# Patient Record
Sex: Male | Born: 1955
Health system: Southern US, Community
[De-identification: ages and names within clinical notes are randomized; demographics above are authoritative.]

## PROBLEM LIST (undated history)

## (undated) DIAGNOSIS — L719 Rosacea, unspecified: Secondary | ICD-10-CM

## (undated) DIAGNOSIS — K5792 Diverticulitis of intestine, part unspecified, without perforation or abscess without bleeding: Secondary | ICD-10-CM

## (undated) DIAGNOSIS — N189 Chronic kidney disease, unspecified: Secondary | ICD-10-CM

## (undated) DIAGNOSIS — I2510 Atherosclerotic heart disease of native coronary artery without angina pectoris: Secondary | ICD-10-CM

## (undated) DIAGNOSIS — C9030 Solitary plasmacytoma not having achieved remission: Secondary | ICD-10-CM

## (undated) DIAGNOSIS — K219 Gastro-esophageal reflux disease without esophagitis: Secondary | ICD-10-CM

## (undated) DIAGNOSIS — D801 Nonfamilial hypogammaglobulinemia: Secondary | ICD-10-CM

## (undated) DIAGNOSIS — Z86010 Personal history of colonic polyps: Secondary | ICD-10-CM

## (undated) DIAGNOSIS — G629 Polyneuropathy, unspecified: Secondary | ICD-10-CM

## (undated) DIAGNOSIS — C801 Malignant (primary) neoplasm, unspecified: Secondary | ICD-10-CM

## (undated) DIAGNOSIS — B999 Unspecified infectious disease: Secondary | ICD-10-CM

## (undated) DIAGNOSIS — I219 Acute myocardial infarction, unspecified: Secondary | ICD-10-CM

## (undated) DIAGNOSIS — I10 Essential (primary) hypertension: Secondary | ICD-10-CM

## (undated) DIAGNOSIS — M79604 Pain in right leg: Principal | ICD-10-CM

## (undated) DIAGNOSIS — IMO0002 Reserved for concepts with insufficient information to code with codable children: Secondary | ICD-10-CM

## (undated) DIAGNOSIS — E7800 Pure hypercholesterolemia, unspecified: Secondary | ICD-10-CM

## (undated) DIAGNOSIS — Z9484 Stem cells transplant status: Secondary | ICD-10-CM

## (undated) DIAGNOSIS — M109 Gout, unspecified: Secondary | ICD-10-CM

## (undated) DIAGNOSIS — E538 Deficiency of other specified B group vitamins: Secondary | ICD-10-CM

## (undated) HISTORY — DX: Stem cells transplant status: Z94.84

## (undated) HISTORY — DX: Gastro-esophageal reflux disease without esophagitis: K21.9

## (undated) HISTORY — DX: Gout, unspecified: M10.9

## (undated) HISTORY — DX: Atherosclerotic heart disease of native coronary artery without angina pectoris: I25.10

## (undated) HISTORY — DX: Polyneuropathy, unspecified: G62.9

## (undated) HISTORY — DX: Personal history of colonic polyps: Z86.010

## (undated) HISTORY — DX: Unspecified infectious disease: B99.9

## (undated) HISTORY — DX: Reserved for concepts with insufficient information to code with codable children: IMO0002

## (undated) HISTORY — DX: Essential (primary) hypertension: I10

## (undated) HISTORY — DX: Acute myocardial infarction, unspecified: I21.9

## (undated) HISTORY — DX: Diverticulitis of intestine, part unspecified, without perforation or abscess without bleeding: K57.92

## (undated) HISTORY — DX: Nonfamilial hypogammaglobulinemia: D80.1

## (undated) HISTORY — PX: COLONOSCOPY W/ POLYPECTOMY: SHX1380

## (undated) HISTORY — DX: Pain in right leg: M79.604

## (undated) HISTORY — DX: Rosacea, unspecified: L71.9

## (undated) HISTORY — DX: Chronic kidney disease, unspecified: N18.9

## (undated) HISTORY — DX: Pure hypercholesterolemia, unspecified: E78.00

## (undated) HISTORY — DX: Deficiency of other specified B group vitamins: E53.8

---

## 1993-08-19 HISTORY — PX: CORONARY ARTERY BYPASS GRAFT: SHX141

## 2000-06-12 ENCOUNTER — Ambulatory Visit (HOSPITAL_COMMUNITY): Admission: RE | Admit: 2000-06-12 | Discharge: 2000-06-12 | Payer: Self-pay | Admitting: Gastroenterology

## 2002-04-15 ENCOUNTER — Encounter: Payer: Self-pay | Admitting: Internal Medicine

## 2002-04-15 ENCOUNTER — Encounter: Admission: RE | Admit: 2002-04-15 | Discharge: 2002-04-15 | Payer: Self-pay | Admitting: Internal Medicine

## 2003-01-10 ENCOUNTER — Encounter: Payer: Self-pay | Admitting: Internal Medicine

## 2003-01-10 ENCOUNTER — Encounter: Admission: RE | Admit: 2003-01-10 | Discharge: 2003-01-10 | Payer: Self-pay | Admitting: Internal Medicine

## 2003-01-12 ENCOUNTER — Encounter: Payer: Self-pay | Admitting: Internal Medicine

## 2003-01-12 ENCOUNTER — Encounter: Admission: RE | Admit: 2003-01-12 | Discharge: 2003-01-12 | Payer: Self-pay | Admitting: Internal Medicine

## 2003-02-27 ENCOUNTER — Encounter: Payer: Self-pay | Admitting: Oncology

## 2003-02-27 ENCOUNTER — Ambulatory Visit (HOSPITAL_COMMUNITY): Admission: RE | Admit: 2003-02-27 | Discharge: 2003-02-27 | Payer: Self-pay | Admitting: Oncology

## 2003-03-13 ENCOUNTER — Ambulatory Visit: Admission: RE | Admit: 2003-03-13 | Discharge: 2003-04-07 | Payer: Self-pay | Admitting: Radiation Oncology

## 2003-03-14 ENCOUNTER — Encounter (INDEPENDENT_AMBULATORY_CARE_PROVIDER_SITE_OTHER): Payer: Self-pay | Admitting: Specialist

## 2003-03-14 ENCOUNTER — Other Ambulatory Visit: Admission: RE | Admit: 2003-03-14 | Discharge: 2003-03-14 | Payer: Self-pay | Admitting: Oncology

## 2003-07-07 ENCOUNTER — Ambulatory Visit (HOSPITAL_COMMUNITY): Admission: RE | Admit: 2003-07-07 | Discharge: 2003-07-07 | Payer: Self-pay | Admitting: Oncology

## 2003-07-07 ENCOUNTER — Encounter: Payer: Self-pay | Admitting: Oncology

## 2004-02-09 ENCOUNTER — Ambulatory Visit (HOSPITAL_COMMUNITY): Admission: RE | Admit: 2004-02-09 | Discharge: 2004-02-09 | Payer: Self-pay | Admitting: Oncology

## 2004-02-17 ENCOUNTER — Ambulatory Visit (HOSPITAL_COMMUNITY): Admission: RE | Admit: 2004-02-17 | Discharge: 2004-02-17 | Payer: Self-pay | Admitting: Oncology

## 2004-03-01 ENCOUNTER — Ambulatory Visit: Admission: RE | Admit: 2004-03-01 | Discharge: 2004-04-26 | Payer: Self-pay | Admitting: Radiation Oncology

## 2004-05-09 ENCOUNTER — Ambulatory Visit: Admission: RE | Admit: 2004-05-09 | Discharge: 2004-05-09 | Payer: Self-pay | Admitting: Radiation Oncology

## 2004-06-14 ENCOUNTER — Ambulatory Visit (HOSPITAL_COMMUNITY): Admission: RE | Admit: 2004-06-14 | Discharge: 2004-06-14 | Payer: Self-pay | Admitting: Oncology

## 2004-06-21 ENCOUNTER — Ambulatory Visit (HOSPITAL_COMMUNITY): Admission: RE | Admit: 2004-06-21 | Discharge: 2004-06-21 | Payer: Self-pay | Admitting: Oncology

## 2004-10-17 ENCOUNTER — Ambulatory Visit (HOSPITAL_COMMUNITY): Admission: RE | Admit: 2004-10-17 | Discharge: 2004-10-17 | Payer: Self-pay | Admitting: Oncology

## 2004-10-25 ENCOUNTER — Ambulatory Visit: Payer: Self-pay | Admitting: Oncology

## 2004-10-29 ENCOUNTER — Ambulatory Visit (HOSPITAL_COMMUNITY): Admission: RE | Admit: 2004-10-29 | Discharge: 2004-10-29 | Payer: Self-pay | Admitting: Oncology

## 2004-10-29 ENCOUNTER — Encounter (INDEPENDENT_AMBULATORY_CARE_PROVIDER_SITE_OTHER): Payer: Self-pay | Admitting: Specialist

## 2004-10-29 ENCOUNTER — Ambulatory Visit: Payer: Self-pay | Admitting: Oncology

## 2004-11-21 HISTORY — PX: OTHER SURGICAL HISTORY: SHX169

## 2004-11-23 ENCOUNTER — Ambulatory Visit (HOSPITAL_COMMUNITY): Admission: RE | Admit: 2004-11-23 | Discharge: 2004-11-23 | Payer: Self-pay | Admitting: Oncology

## 2004-11-26 ENCOUNTER — Ambulatory Visit (HOSPITAL_BASED_OUTPATIENT_CLINIC_OR_DEPARTMENT_OTHER): Admission: RE | Admit: 2004-11-26 | Discharge: 2004-11-26 | Payer: Self-pay | Admitting: General Surgery

## 2004-11-26 ENCOUNTER — Encounter (INDEPENDENT_AMBULATORY_CARE_PROVIDER_SITE_OTHER): Payer: Self-pay | Admitting: Specialist

## 2004-12-05 ENCOUNTER — Ambulatory Visit: Admission: RE | Admit: 2004-12-05 | Discharge: 2005-01-17 | Payer: Self-pay | Admitting: Radiation Oncology

## 2005-01-06 ENCOUNTER — Ambulatory Visit (HOSPITAL_COMMUNITY): Admission: RE | Admit: 2005-01-06 | Discharge: 2005-01-06 | Payer: Self-pay | Admitting: Radiation Oncology

## 2005-01-17 ENCOUNTER — Ambulatory Visit: Payer: Self-pay | Admitting: Oncology

## 2005-02-03 ENCOUNTER — Encounter (INDEPENDENT_AMBULATORY_CARE_PROVIDER_SITE_OTHER): Payer: Self-pay | Admitting: *Deleted

## 2005-02-03 ENCOUNTER — Inpatient Hospital Stay (HOSPITAL_COMMUNITY): Admission: RE | Admit: 2005-02-03 | Discharge: 2005-02-07 | Payer: Self-pay | Admitting: Orthopedic Surgery

## 2005-02-03 HISTORY — PX: TOTAL HIP ARTHROPLASTY: SHX124

## 2005-03-13 ENCOUNTER — Ambulatory Visit: Payer: Self-pay | Admitting: Oncology

## 2005-04-23 ENCOUNTER — Ambulatory Visit (HOSPITAL_COMMUNITY): Admission: RE | Admit: 2005-04-23 | Discharge: 2005-04-23 | Payer: Self-pay | Admitting: Oncology

## 2005-04-29 ENCOUNTER — Ambulatory Visit: Payer: Self-pay | Admitting: Oncology

## 2005-06-19 ENCOUNTER — Ambulatory Visit: Admission: RE | Admit: 2005-06-19 | Discharge: 2005-07-28 | Payer: Self-pay | Admitting: Radiation Oncology

## 2005-07-07 ENCOUNTER — Ambulatory Visit (HOSPITAL_COMMUNITY): Admission: RE | Admit: 2005-07-07 | Discharge: 2005-07-07 | Payer: Self-pay | Admitting: Nephrology

## 2005-07-10 ENCOUNTER — Ambulatory Visit: Payer: Self-pay | Admitting: Oncology

## 2005-07-24 ENCOUNTER — Ambulatory Visit (HOSPITAL_COMMUNITY): Admission: RE | Admit: 2005-07-24 | Discharge: 2005-07-24 | Payer: Self-pay | Admitting: Oncology

## 2005-07-28 ENCOUNTER — Ambulatory Visit (HOSPITAL_COMMUNITY): Admission: RE | Admit: 2005-07-28 | Discharge: 2005-07-28 | Payer: Self-pay | Admitting: Urology

## 2005-08-11 ENCOUNTER — Ambulatory Visit (HOSPITAL_BASED_OUTPATIENT_CLINIC_OR_DEPARTMENT_OTHER): Admission: RE | Admit: 2005-08-11 | Discharge: 2005-08-11 | Payer: Self-pay | Admitting: Urology

## 2005-08-11 ENCOUNTER — Ambulatory Visit (HOSPITAL_COMMUNITY): Admission: RE | Admit: 2005-08-11 | Discharge: 2005-08-11 | Payer: Self-pay | Admitting: Urology

## 2005-09-04 ENCOUNTER — Ambulatory Visit: Payer: Self-pay | Admitting: Oncology

## 2005-10-08 ENCOUNTER — Ambulatory Visit (HOSPITAL_COMMUNITY): Admission: RE | Admit: 2005-10-08 | Discharge: 2005-10-08 | Payer: Self-pay | Admitting: Oncology

## 2005-10-20 ENCOUNTER — Ambulatory Visit: Payer: Self-pay | Admitting: Oncology

## 2005-12-05 ENCOUNTER — Ambulatory Visit: Payer: Self-pay | Admitting: Oncology

## 2006-01-12 ENCOUNTER — Ambulatory Visit (HOSPITAL_COMMUNITY): Admission: RE | Admit: 2006-01-12 | Discharge: 2006-01-13 | Payer: Self-pay | Admitting: Cardiology

## 2006-01-26 ENCOUNTER — Ambulatory Visit (HOSPITAL_BASED_OUTPATIENT_CLINIC_OR_DEPARTMENT_OTHER): Admission: RE | Admit: 2006-01-26 | Discharge: 2006-01-26 | Payer: Self-pay | Admitting: Urology

## 2006-02-05 ENCOUNTER — Ambulatory Visit: Payer: Self-pay | Admitting: Oncology

## 2006-02-06 ENCOUNTER — Ambulatory Visit (HOSPITAL_COMMUNITY): Admission: RE | Admit: 2006-02-06 | Discharge: 2006-02-06 | Payer: Self-pay | Admitting: Oncology

## 2006-02-06 ENCOUNTER — Encounter (INDEPENDENT_AMBULATORY_CARE_PROVIDER_SITE_OTHER): Payer: Self-pay | Admitting: Specialist

## 2006-02-19 DIAGNOSIS — Z9484 Stem cells transplant status: Secondary | ICD-10-CM

## 2006-02-19 HISTORY — DX: Stem cells transplant status: Z94.84

## 2006-03-27 ENCOUNTER — Ambulatory Visit: Payer: Self-pay | Admitting: Oncology

## 2006-03-30 LAB — URIC ACID: Uric Acid, Serum: 5.4 mg/dL (ref 2.4–7.0)

## 2006-03-30 LAB — COMPREHENSIVE METABOLIC PANEL
ALT: 13 U/L (ref 0–40)
AST: 15 U/L (ref 0–37)
Calcium: 9.5 mg/dL (ref 8.4–10.5)
Chloride: 106 mEq/L (ref 96–112)
Creatinine, Ser: 1.2 mg/dL (ref 0.4–1.5)
Potassium: 4.6 mEq/L (ref 3.5–5.3)
Sodium: 140 mEq/L (ref 135–145)
Total Protein: 6.5 g/dL (ref 6.0–8.3)

## 2006-03-30 LAB — CBC WITH DIFFERENTIAL/PLATELET
BASO%: 0.1 % (ref 0.0–2.0)
EOS%: 2.3 % (ref 0.0–7.0)
MCH: 29.8 pg (ref 28.0–33.4)
MCHC: 33.9 g/dL (ref 32.0–35.9)
MONO#: 1 10*3/uL — ABNORMAL HIGH (ref 0.1–0.9)
NEUT%: 74.9 % (ref 40.0–75.0)
RBC: 4.01 10*6/uL — ABNORMAL LOW (ref 4.20–5.71)
RDW: 16.8 % — ABNORMAL HIGH (ref 11.2–14.6)
WBC: 6.2 10*3/uL (ref 4.0–10.0)
lymph#: 0.4 10*3/uL — ABNORMAL LOW (ref 0.9–3.3)

## 2006-04-06 LAB — CBC WITH DIFFERENTIAL/PLATELET
BASO%: 0 % (ref 0.0–2.0)
EOS%: 0.6 % (ref 0.0–7.0)
HCT: 37.5 % — ABNORMAL LOW (ref 38.7–49.9)
LYMPH%: 9.6 % — ABNORMAL LOW (ref 14.0–48.0)
MCH: 30.2 pg (ref 28.0–33.4)
MCHC: 34.1 g/dL (ref 32.0–35.9)
NEUT%: 78.4 % — ABNORMAL HIGH (ref 40.0–75.0)
lymph#: 0.7 10*3/uL — ABNORMAL LOW (ref 0.9–3.3)

## 2006-04-06 LAB — COMPREHENSIVE METABOLIC PANEL
ALT: 12 U/L (ref 0–40)
AST: 16 U/L (ref 0–37)
Alkaline Phosphatase: 42 U/L (ref 39–117)
Chloride: 108 mEq/L (ref 96–112)
Creatinine, Ser: 1.1 mg/dL (ref 0.4–1.5)
Total Bilirubin: 0.7 mg/dL (ref 0.3–1.2)

## 2006-04-13 LAB — CBC WITH DIFFERENTIAL/PLATELET
BASO%: 0.3 % (ref 0.0–2.0)
EOS%: 0.4 % (ref 0.0–7.0)
HCT: 33.6 % — ABNORMAL LOW (ref 38.7–49.9)
LYMPH%: 10.2 % — ABNORMAL LOW (ref 14.0–48.0)
MCH: 31.1 pg (ref 28.0–33.4)
MCHC: 34.5 g/dL (ref 32.0–35.9)
NEUT%: 75.1 % — ABNORMAL HIGH (ref 40.0–75.0)
Platelets: 153 10*3/uL (ref 145–400)

## 2006-04-13 LAB — COMPREHENSIVE METABOLIC PANEL
Albumin: 4.6 g/dL (ref 3.5–5.2)
BUN: 14 mg/dL (ref 6–23)
CO2: 24 mEq/L (ref 19–32)
Calcium: 9.5 mg/dL (ref 8.4–10.5)
Chloride: 110 mEq/L (ref 96–112)
Creatinine, Ser: 0.9 mg/dL (ref 0.4–1.5)
Glucose, Bld: 106 mg/dL — ABNORMAL HIGH (ref 70–99)
Potassium: 4.3 mEq/L (ref 3.5–5.3)

## 2006-04-20 LAB — COMPREHENSIVE METABOLIC PANEL
ALT: 12 U/L (ref 0–40)
AST: 11 U/L (ref 0–37)
Albumin: 4.3 g/dL (ref 3.5–5.2)
BUN: 7 mg/dL (ref 6–23)
CO2: 26 mEq/L (ref 19–32)
Calcium: 9.1 mg/dL (ref 8.4–10.5)
Chloride: 111 mEq/L (ref 96–112)
Creatinine, Ser: 1 mg/dL (ref 0.4–1.5)
Potassium: 4.1 mEq/L (ref 3.5–5.3)

## 2006-04-20 LAB — CBC WITH DIFFERENTIAL/PLATELET
BASO%: 0.3 % (ref 0.0–2.0)
Basophils Absolute: 0 10*3/uL (ref 0.0–0.1)
EOS%: 0.6 % (ref 0.0–7.0)
HCT: 33.7 % — ABNORMAL LOW (ref 38.7–49.9)
HGB: 11.5 g/dL — ABNORMAL LOW (ref 13.0–17.1)
MCH: 31.1 pg (ref 28.0–33.4)
MONO#: 0.7 10*3/uL (ref 0.1–0.9)
NEUT#: 3.7 10*3/uL (ref 1.5–6.5)
NEUT%: 73.6 % (ref 40.0–75.0)
RDW: 16.9 % — ABNORMAL HIGH (ref 11.2–14.6)
WBC: 5.1 10*3/uL (ref 4.0–10.0)
lymph#: 0.6 10*3/uL — ABNORMAL LOW (ref 0.9–3.3)

## 2006-05-25 ENCOUNTER — Ambulatory Visit: Payer: Self-pay | Admitting: Oncology

## 2006-05-27 ENCOUNTER — Ambulatory Visit (HOSPITAL_COMMUNITY): Admission: RE | Admit: 2006-05-27 | Discharge: 2006-05-27 | Payer: Self-pay | Admitting: Oncology

## 2006-05-27 LAB — CBC WITH DIFFERENTIAL/PLATELET
Basophils Absolute: 0 10*3/uL (ref 0.0–0.1)
EOS%: 0.7 % (ref 0.0–7.0)
Eosinophils Absolute: 0 10*3/uL (ref 0.0–0.5)
HCT: 38.4 % — ABNORMAL LOW (ref 38.7–49.9)
HGB: 13.3 g/dL (ref 13.0–17.1)
MCH: 31.6 pg (ref 28.0–33.4)
MCV: 91.3 fL (ref 81.6–98.0)
MONO%: 18.8 % — ABNORMAL HIGH (ref 0.0–13.0)
NEUT%: 66.6 % (ref 40.0–75.0)
lymph#: 0.5 10*3/uL — ABNORMAL LOW (ref 0.9–3.3)

## 2006-05-29 LAB — COMPREHENSIVE METABOLIC PANEL
Albumin: 4.7 g/dL (ref 3.5–5.2)
Alkaline Phosphatase: 37 U/L — ABNORMAL LOW (ref 39–117)
BUN: 17 mg/dL (ref 6–23)
CO2: 27 mEq/L (ref 19–32)
Calcium: 9.5 mg/dL (ref 8.4–10.5)
Glucose, Bld: 100 mg/dL — ABNORMAL HIGH (ref 70–99)
Potassium: 4.9 mEq/L (ref 3.5–5.3)

## 2006-05-29 LAB — SPEP & IFE WITH QIG
Albumin ELP: 69.7 % — ABNORMAL HIGH (ref 55.8–66.1)
Alpha-1-Globulin: 4.5 % (ref 2.9–4.9)
Alpha-2-Globulin: 11 % (ref 7.1–11.8)
IgM, Serum: 22 mg/dL — ABNORMAL LOW (ref 60–263)

## 2006-05-29 LAB — LACTATE DEHYDROGENASE: LDH: 148 U/L (ref 94–250)

## 2006-05-29 LAB — KAPPA/LAMBDA LIGHT CHAINS
Kappa free light chain: 0.93 mg/dL (ref 0.33–1.94)
Kappa:Lambda Ratio: 3.72 — ABNORMAL HIGH (ref 0.26–1.65)
Lambda Free Lght Chn: 0.25 mg/dL — ABNORMAL LOW (ref 0.57–2.63)

## 2006-05-29 LAB — BETA 2 MICROGLOBULIN, SERUM: Beta-2 Microglobulin: 2.14 mg/L — ABNORMAL HIGH (ref 1.01–1.73)

## 2006-08-30 ENCOUNTER — Ambulatory Visit: Payer: Self-pay | Admitting: Oncology

## 2006-09-01 ENCOUNTER — Encounter (INDEPENDENT_AMBULATORY_CARE_PROVIDER_SITE_OTHER): Payer: Self-pay | Admitting: *Deleted

## 2006-09-01 ENCOUNTER — Other Ambulatory Visit: Admission: RE | Admit: 2006-09-01 | Discharge: 2006-09-01 | Payer: Self-pay | Admitting: Oncology

## 2006-09-01 LAB — CBC WITH DIFFERENTIAL/PLATELET
BASO%: 0.4 % (ref 0.0–2.0)
EOS%: 1.9 % (ref 0.0–7.0)
MCH: 30.5 pg (ref 28.0–33.4)
MCHC: 34.6 g/dL (ref 32.0–35.9)
MONO%: 13.3 % — ABNORMAL HIGH (ref 0.0–13.0)
NEUT%: 70.3 % (ref 40.0–75.0)
RDW: 13.4 % (ref 11.2–14.6)
lymph#: 0.7 10*3/uL — ABNORMAL LOW (ref 0.9–3.3)

## 2006-09-04 LAB — SPEP & IFE WITH QIG
Alpha-2-Globulin: 10.7 % (ref 7.1–11.8)
Beta 2: 2.9 % — ABNORMAL LOW (ref 3.2–6.5)
Gamma Globulin: 6.4 % — ABNORMAL LOW (ref 11.1–18.8)
IgG (Immunoglobin G), Serum: 375 mg/dL — ABNORMAL LOW (ref 694–1618)

## 2006-09-04 LAB — BETA 2 MICROGLOBULIN, SERUM: Beta-2 Microglobulin: 1.94 mg/L — ABNORMAL HIGH (ref 1.01–1.73)

## 2006-09-04 LAB — COMPREHENSIVE METABOLIC PANEL
Albumin: 4.9 g/dL (ref 3.5–5.2)
Alkaline Phosphatase: 48 U/L (ref 39–117)
BUN: 19 mg/dL (ref 6–23)
Glucose, Bld: 125 mg/dL — ABNORMAL HIGH (ref 70–99)
Total Bilirubin: 0.7 mg/dL (ref 0.3–1.2)

## 2006-09-04 LAB — KAPPA/LAMBDA LIGHT CHAINS: Kappa free light chain: 0.93 mg/dL (ref 0.33–1.94)

## 2006-09-11 ENCOUNTER — Ambulatory Visit (HOSPITAL_COMMUNITY): Admission: RE | Admit: 2006-09-11 | Discharge: 2006-09-11 | Payer: Self-pay | Admitting: Oncology

## 2006-09-14 LAB — CBC WITH DIFFERENTIAL/PLATELET
BASO%: 0.4 % (ref 0.0–2.0)
LYMPH%: 9.8 % — ABNORMAL LOW (ref 14.0–48.0)
MCHC: 34.9 g/dL (ref 32.0–35.9)
MONO#: 0.6 10*3/uL (ref 0.1–0.9)
Platelets: 132 10*3/uL — ABNORMAL LOW (ref 145–400)
RBC: 4.5 10*6/uL (ref 4.20–5.71)
RDW: 13.5 % (ref 11.2–14.6)
WBC: 5.9 10*3/uL (ref 4.0–10.0)
lymph#: 0.6 10*3/uL — ABNORMAL LOW (ref 0.9–3.3)

## 2006-09-14 LAB — ERYTHROCYTE SEDIMENTATION RATE: Sed Rate: 6 mm/hr (ref 0–20)

## 2006-09-19 LAB — COMPREHENSIVE METABOLIC PANEL
ALT: 19 U/L (ref 0–40)
AST: 13 U/L (ref 0–37)
Calcium: 9.5 mg/dL (ref 8.4–10.5)
Chloride: 103 mEq/L (ref 96–112)
Creatinine, Ser: 1.19 mg/dL (ref 0.40–1.50)
Total Bilirubin: 0.8 mg/dL (ref 0.3–1.2)

## 2006-09-19 LAB — UIFE/LIGHT CHAINS/TP QN, 24-HR UR
Free Kappa Lt Chains,Ur: 0.79 mg/dL (ref 0.04–1.51)
Free Lt Chn Excr Rate: 14.22 mg/d

## 2006-09-19 LAB — SPEP & IFE WITH QIG
Beta 2: 3.7 % (ref 3.2–6.5)
Gamma Globulin: 5.9 % — ABNORMAL LOW (ref 11.1–18.8)
IgA: 64 mg/dL — ABNORMAL LOW (ref 68–378)
IgM, Serum: 45 mg/dL — ABNORMAL LOW (ref 60–263)

## 2006-09-19 LAB — KAPPA/LAMBDA LIGHT CHAINS
Kappa:Lambda Ratio: 1.3 (ref 0.26–1.65)
Lambda Free Lght Chn: 0.5 mg/dL — ABNORMAL LOW (ref 0.57–2.63)

## 2006-12-09 ENCOUNTER — Ambulatory Visit: Payer: Self-pay | Admitting: Oncology

## 2006-12-14 LAB — CBC WITH DIFFERENTIAL/PLATELET
Basophils Absolute: 0.1 10*3/uL (ref 0.0–0.1)
EOS%: 2.8 % (ref 0.0–7.0)
HGB: 14.5 g/dL (ref 13.0–17.1)
MCH: 29.2 pg (ref 28.0–33.4)
MCV: 85.2 fL (ref 81.6–98.0)
MONO%: 13.8 % — ABNORMAL HIGH (ref 0.0–13.0)
NEUT%: 66.8 % (ref 40.0–75.0)
RDW: 12.5 % (ref 11.2–14.6)

## 2006-12-18 LAB — COMPREHENSIVE METABOLIC PANEL
AST: 12 U/L (ref 0–37)
Alkaline Phosphatase: 54 U/L (ref 39–117)
BUN: 25 mg/dL — ABNORMAL HIGH (ref 6–23)
Creatinine, Ser: 1.17 mg/dL (ref 0.40–1.50)
Potassium: 5 mEq/L (ref 3.5–5.3)

## 2006-12-18 LAB — SPEP & IFE WITH QIG
Albumin ELP: 64.4 % (ref 55.8–66.1)
IgA: 100 mg/dL (ref 68–378)
IgM, Serum: 128 mg/dL (ref 60–263)
Total Protein, Serum Electrophoresis: 6.8 g/dL (ref 6.0–8.3)

## 2006-12-18 LAB — KAPPA/LAMBDA LIGHT CHAINS
Kappa:Lambda Ratio: 1.64 (ref 0.26–1.65)
Lambda Free Lght Chn: 0.75 mg/dL (ref 0.57–2.63)

## 2006-12-29 LAB — UIFE/LIGHT CHAINS/TP QN, 24-HR UR
Alpha 2, Urine: DETECTED — AB
Beta, Urine: DETECTED — AB
Free Kappa Lt Chains,Ur: 1.55 mg/dL — ABNORMAL HIGH (ref 0.04–1.51)
Free Lambda Lt Chains,Ur: 0.12 mg/dL (ref 0.08–1.01)
Free Lt Chn Excr Rate: 25.58 mg/d
Volume, Urine: 1650 mL

## 2007-02-08 ENCOUNTER — Ambulatory Visit (HOSPITAL_COMMUNITY): Admission: RE | Admit: 2007-02-08 | Discharge: 2007-02-08 | Payer: Self-pay | Admitting: Oncology

## 2007-02-08 ENCOUNTER — Ambulatory Visit: Payer: Self-pay | Admitting: Oncology

## 2007-02-08 LAB — CBC WITH DIFFERENTIAL/PLATELET
Basophils Absolute: 0.1 10*3/uL (ref 0.0–0.1)
EOS%: 0.4 % (ref 0.0–7.0)
HCT: 38.6 % — ABNORMAL LOW (ref 38.7–49.9)
HGB: 13.5 g/dL (ref 13.0–17.1)
LYMPH%: 10.7 % — ABNORMAL LOW (ref 14.0–48.0)
MCH: 29.3 pg (ref 28.0–33.4)
MCHC: 34.9 g/dL (ref 32.0–35.9)
MCV: 83.8 fL (ref 81.6–98.0)
MONO%: 24.2 % — ABNORMAL HIGH (ref 0.0–13.0)
NEUT%: 63.9 % (ref 40.0–75.0)
Platelets: 151 10*3/uL (ref 145–400)

## 2007-02-08 LAB — COMPREHENSIVE METABOLIC PANEL
Albumin: 3.9 g/dL (ref 3.5–5.2)
BUN: 21 mg/dL (ref 6–23)
CO2: 26 mEq/L (ref 19–32)
Calcium: 9.4 mg/dL (ref 8.4–10.5)
Chloride: 105 mEq/L (ref 96–112)
Creatinine, Ser: 1.54 mg/dL — ABNORMAL HIGH (ref 0.40–1.50)
Potassium: 3.7 mEq/L (ref 3.5–5.3)

## 2007-02-08 LAB — LACTATE DEHYDROGENASE: LDH: 163 U/L (ref 94–250)

## 2007-02-11 LAB — BASIC METABOLIC PANEL
Calcium: 9.2 mg/dL (ref 8.4–10.5)
Sodium: 140 mEq/L (ref 135–145)

## 2007-02-11 LAB — THROAT CULTURE

## 2007-02-14 LAB — CULTURE, BLOOD (SINGLE)

## 2007-02-16 ENCOUNTER — Ambulatory Visit (HOSPITAL_COMMUNITY): Admission: RE | Admit: 2007-02-16 | Discharge: 2007-02-16 | Payer: Self-pay | Admitting: Oncology

## 2007-02-17 LAB — CYTOMEGALOVIRUS PCR, QUALITATIVE: Cytomegalovirus DNA: NOT DETECTED

## 2007-02-18 LAB — MYCOPLASMA PNEUMONIAE,IGG,IGM: M. pneumoniae Ab, IgG: <0.91 Index (ref <0.91)

## 2007-03-23 ENCOUNTER — Ambulatory Visit: Payer: Self-pay | Admitting: Oncology

## 2007-03-26 LAB — CBC WITH DIFFERENTIAL/PLATELET
BASO%: 0.7 % (ref 0.0–2.0)
Basophils Absolute: 0 10*3/uL (ref 0.0–0.1)
HCT: 39.3 % (ref 38.7–49.9)
HGB: 14 g/dL (ref 13.0–17.1)
MCHC: 35.7 g/dL (ref 32.0–35.9)
MONO#: 0.7 10*3/uL (ref 0.1–0.9)
NEUT%: 55.6 % (ref 40.0–75.0)
WBC: 5.4 10*3/uL (ref 4.0–10.0)
lymph#: 1.6 10*3/uL (ref 0.9–3.3)

## 2007-03-26 LAB — COMPREHENSIVE METABOLIC PANEL
ALT: 17 U/L (ref 0–53)
CO2: 25 mEq/L (ref 19–32)
Calcium: 9.6 mg/dL (ref 8.4–10.5)
Chloride: 104 mEq/L (ref 96–112)
Creatinine, Ser: 1.08 mg/dL (ref 0.40–1.50)

## 2007-03-29 ENCOUNTER — Ambulatory Visit (HOSPITAL_COMMUNITY): Admission: RE | Admit: 2007-03-29 | Discharge: 2007-03-29 | Payer: Self-pay | Admitting: Oncology

## 2007-04-26 LAB — CBC WITH DIFFERENTIAL/PLATELET
Basophils Absolute: 0 10*3/uL (ref 0.0–0.1)
Eosinophils Absolute: 0.1 10*3/uL (ref 0.0–0.5)
HCT: 35.4 % — ABNORMAL LOW (ref 38.7–49.9)
HGB: 12.6 g/dL — ABNORMAL LOW (ref 13.0–17.1)
MONO#: 0.8 10*3/uL (ref 0.1–0.9)
NEUT#: 3.6 10*3/uL (ref 1.5–6.5)
NEUT%: 62.1 % (ref 40.0–75.0)
RDW: 14.4 % (ref 11.2–14.6)
lymph#: 1.3 10*3/uL (ref 0.9–3.3)

## 2007-04-26 LAB — COMPREHENSIVE METABOLIC PANEL
AST: 14 U/L (ref 0–37)
Albumin: 4.5 g/dL (ref 3.5–5.2)
BUN: 26 mg/dL — ABNORMAL HIGH (ref 6–23)
CO2: 25 mEq/L (ref 19–32)
Calcium: 9.3 mg/dL (ref 8.4–10.5)
Chloride: 106 mEq/L (ref 96–112)
Creatinine, Ser: 1.14 mg/dL (ref 0.40–1.50)
Glucose, Bld: 89 mg/dL (ref 70–99)
Potassium: 4.3 mEq/L (ref 3.5–5.3)

## 2007-05-10 ENCOUNTER — Ambulatory Visit: Payer: Self-pay | Admitting: Oncology

## 2007-06-11 ENCOUNTER — Ambulatory Visit: Payer: Self-pay | Admitting: Oncology

## 2007-06-11 ENCOUNTER — Ambulatory Visit (HOSPITAL_COMMUNITY): Admission: RE | Admit: 2007-06-11 | Discharge: 2007-06-11 | Payer: Self-pay | Admitting: Oncology

## 2007-06-11 LAB — CBC WITH DIFFERENTIAL/PLATELET
Basophils Absolute: 0 10*3/uL (ref 0.0–0.1)
EOS%: 0.4 % (ref 0.0–7.0)
HGB: 14.3 g/dL (ref 13.0–17.1)
MCH: 30.9 pg (ref 28.0–33.4)
MONO#: 1 10*3/uL — ABNORMAL HIGH (ref 0.1–0.9)
NEUT#: 7.4 10*3/uL — ABNORMAL HIGH (ref 1.5–6.5)
RDW: 13.9 % (ref 11.2–14.6)
WBC: 9.8 10*3/uL (ref 4.0–10.0)
lymph#: 1.3 10*3/uL (ref 0.9–3.3)

## 2007-06-11 LAB — BASIC METABOLIC PANEL
BUN: 16 mg/dL (ref 6–23)
CO2: 28 mEq/L (ref 19–32)
Chloride: 104 mEq/L (ref 96–112)
Potassium: 4.9 mEq/L (ref 3.5–5.3)

## 2007-07-05 LAB — CBC WITH DIFFERENTIAL/PLATELET
Basophils Absolute: 0 10*3/uL (ref 0.0–0.1)
Eosinophils Absolute: 0.1 10*3/uL (ref 0.0–0.5)
HCT: 39.8 % (ref 38.7–49.9)
HGB: 14.2 g/dL (ref 13.0–17.1)
MCH: 31.3 pg (ref 28.0–33.4)
MONO#: 1.1 10*3/uL — ABNORMAL HIGH (ref 0.1–0.9)
NEUT#: 4.9 10*3/uL (ref 1.5–6.5)
NEUT%: 63.1 % (ref 40.0–75.0)
WBC: 7.7 10*3/uL (ref 4.0–10.0)
lymph#: 1.6 10*3/uL (ref 0.9–3.3)

## 2007-07-07 LAB — PROTEIN ELECTROPHORESIS, SERUM
Albumin ELP: 64.6 % (ref 55.8–66.1)
Alpha-1-Globulin: 4.4 % (ref 2.9–4.9)

## 2007-07-07 LAB — LACTATE DEHYDROGENASE: LDH: 123 U/L (ref 94–250)

## 2007-07-07 LAB — COMPREHENSIVE METABOLIC PANEL
Albumin: 5 g/dL (ref 3.5–5.2)
BUN: 25 mg/dL — ABNORMAL HIGH (ref 6–23)
CO2: 25 mEq/L (ref 19–32)
Calcium: 10 mg/dL (ref 8.4–10.5)
Chloride: 103 mEq/L (ref 96–112)
Creatinine, Ser: 1.39 mg/dL (ref 0.40–1.50)
Glucose, Bld: 103 mg/dL — ABNORMAL HIGH (ref 70–99)
Potassium: 4.7 mEq/L (ref 3.5–5.3)

## 2007-08-03 ENCOUNTER — Ambulatory Visit: Payer: Self-pay | Admitting: Oncology

## 2007-08-03 ENCOUNTER — Ambulatory Visit (HOSPITAL_COMMUNITY): Admission: RE | Admit: 2007-08-03 | Discharge: 2007-08-03 | Payer: Self-pay | Admitting: Oncology

## 2007-08-03 LAB — COMPREHENSIVE METABOLIC PANEL
ALT: 31 U/L (ref 0–53)
AST: 30 U/L (ref 0–37)
Alkaline Phosphatase: 52 U/L (ref 39–117)
CO2: 26 mEq/L (ref 19–32)
Creatinine, Ser: 1.38 mg/dL (ref 0.40–1.50)
Sodium: 139 mEq/L (ref 135–145)
Total Bilirubin: 0.8 mg/dL (ref 0.3–1.2)
Total Protein: 7.5 g/dL (ref 6.0–8.3)

## 2007-08-03 LAB — CBC WITH DIFFERENTIAL/PLATELET
BASO%: 0.4 % (ref 0.0–2.0)
EOS%: 2.6 % (ref 0.0–7.0)
HCT: 38.8 % (ref 38.7–49.9)
HGB: 13.9 g/dL (ref 13.0–17.1)
MCH: 31.9 pg (ref 28.0–33.4)
MCHC: 35.9 g/dL (ref 32.0–35.9)
MONO#: 0.8 10*3/uL (ref 0.1–0.9)
NEUT%: 51.1 % (ref 40.0–75.0)
RDW: 15.1 % — ABNORMAL HIGH (ref 11.2–14.6)
WBC: 4 10*3/uL (ref 4.0–10.0)
lymph#: 1 10*3/uL (ref 0.9–3.3)

## 2007-08-03 LAB — URINALYSIS, MICROSCOPIC - CHCC
Bilirubin (Urine): NEGATIVE
Ketones: NEGATIVE mg/dL
Protein: NEGATIVE mg/dL
RBC count: NEGATIVE (ref 0–2)
Specific Gravity, Urine: 1.025 (ref 1.003–1.035)

## 2007-08-05 LAB — URINE CULTURE

## 2007-08-12 LAB — T-HELPER CELLS (CD4) COUNT (NOT AT ARMC)

## 2007-08-31 ENCOUNTER — Emergency Department (HOSPITAL_COMMUNITY): Admission: EM | Admit: 2007-08-31 | Discharge: 2007-08-31 | Payer: Self-pay | Admitting: *Deleted

## 2007-09-22 ENCOUNTER — Ambulatory Visit: Payer: Self-pay | Admitting: Oncology

## 2007-09-24 LAB — CBC WITH DIFFERENTIAL/PLATELET
BASO%: 0.4 % (ref 0.0–2.0)
Eosinophils Absolute: 0.1 10*3/uL (ref 0.0–0.5)
HCT: 38.5 % — ABNORMAL LOW (ref 38.7–49.9)
LYMPH%: 25.7 % (ref 14.0–48.0)
MCHC: 36.2 g/dL — ABNORMAL HIGH (ref 32.0–35.9)
MONO#: 0.6 10*3/uL (ref 0.1–0.9)
NEUT#: 3.3 10*3/uL (ref 1.5–6.5)
NEUT%: 60.8 % (ref 40.0–75.0)
Platelets: 158 10*3/uL (ref 145–400)
WBC: 5.4 10*3/uL (ref 4.0–10.0)
lymph#: 1.4 10*3/uL (ref 0.9–3.3)

## 2007-09-28 LAB — COMPREHENSIVE METABOLIC PANEL
ALT: 22 U/L (ref 0–53)
CO2: 25 mEq/L (ref 19–32)
Calcium: 9.3 mg/dL (ref 8.4–10.5)
Chloride: 105 mEq/L (ref 96–112)
Creatinine, Ser: 1.25 mg/dL (ref 0.40–1.50)
Glucose, Bld: 97 mg/dL (ref 70–99)
Sodium: 142 mEq/L (ref 135–145)
Total Bilirubin: 0.7 mg/dL (ref 0.3–1.2)
Total Protein: 7 g/dL (ref 6.0–8.3)

## 2007-09-28 LAB — LACTATE DEHYDROGENASE: LDH: 133 U/L (ref 94–250)

## 2007-09-28 LAB — PROTEIN ELECTROPHORESIS, SERUM
Alpha-2-Globulin: 10.6 % (ref 7.1–11.8)
Beta Globulin: 6.8 % (ref 4.7–7.2)
Gamma Globulin: 8.8 % — ABNORMAL LOW (ref 11.1–18.8)

## 2007-09-28 LAB — IGG, IGA, IGM: IgG (Immunoglobin G), Serum: 692 mg/dL — ABNORMAL LOW (ref 694–1618)

## 2007-10-11 ENCOUNTER — Ambulatory Visit (HOSPITAL_COMMUNITY): Admission: RE | Admit: 2007-10-11 | Discharge: 2007-10-11 | Payer: Self-pay | Admitting: Oncology

## 2007-10-11 LAB — URINALYSIS, MICROSCOPIC - CHCC
Glucose: NEGATIVE g/dL
Nitrite: NEGATIVE
Protein: 30 mg/dL
Specific Gravity, Urine: 1.025 (ref 1.003–1.035)
pH: 6 (ref 4.6–8.0)

## 2007-10-11 LAB — COMPREHENSIVE METABOLIC PANEL
ALT: 31 U/L (ref 0–53)
AST: 34 U/L (ref 0–37)
Albumin: 4.6 g/dL (ref 3.5–5.2)
BUN: 17 mg/dL (ref 6–23)
CO2: 28 mEq/L (ref 19–32)
Calcium: 9.8 mg/dL (ref 8.4–10.5)
Chloride: 102 mEq/L (ref 96–112)
Potassium: 4.2 mEq/L (ref 3.5–5.3)

## 2007-10-11 LAB — CBC WITH DIFFERENTIAL/PLATELET
Basophils Absolute: 0 10*3/uL (ref 0.0–0.1)
Eosinophils Absolute: 0.1 10*3/uL (ref 0.0–0.5)
HCT: 41.2 % (ref 38.7–49.9)
HGB: 14.4 g/dL (ref 13.0–17.1)
LYMPH%: 12.8 % — ABNORMAL LOW (ref 14.0–48.0)
MCV: 89.7 fL (ref 81.6–98.0)
MONO#: 0.8 10*3/uL (ref 0.1–0.9)
NEUT#: 4.3 10*3/uL (ref 1.5–6.5)
NEUT%: 71.7 % (ref 40.0–75.0)
Platelets: 136 10*3/uL — ABNORMAL LOW (ref 145–400)
WBC: 6.1 10*3/uL (ref 4.0–10.0)

## 2007-10-14 LAB — CULTURE, RESPIRATORY W GRAM STAIN

## 2007-10-15 LAB — CMV IGM: CMV IgM: <0.9 Index (ref <0.90)

## 2007-10-15 LAB — HIV ANTIBODY (ROUTINE TESTING W REFLEX)

## 2007-11-09 ENCOUNTER — Encounter: Admission: RE | Admit: 2007-11-09 | Discharge: 2007-11-09 | Payer: Self-pay | Admitting: Infectious Diseases

## 2007-11-09 ENCOUNTER — Ambulatory Visit: Payer: Self-pay | Admitting: Infectious Diseases

## 2007-11-09 DIAGNOSIS — C9001 Multiple myeloma in remission: Secondary | ICD-10-CM

## 2007-11-09 DIAGNOSIS — R509 Fever, unspecified: Secondary | ICD-10-CM

## 2007-11-09 DIAGNOSIS — G4700 Insomnia, unspecified: Secondary | ICD-10-CM | POA: Insufficient documentation

## 2007-11-09 DIAGNOSIS — Z96649 Presence of unspecified artificial hip joint: Secondary | ICD-10-CM | POA: Insufficient documentation

## 2007-11-09 DIAGNOSIS — I25810 Atherosclerosis of coronary artery bypass graft(s) without angina pectoris: Secondary | ICD-10-CM | POA: Insufficient documentation

## 2007-11-09 DIAGNOSIS — I10 Essential (primary) hypertension: Secondary | ICD-10-CM | POA: Insufficient documentation

## 2007-11-09 LAB — CONVERTED CEMR LAB
Basophils Relative: 0 % (ref 0–1)
Cytomegalovirus Ab-IgG: NEGATIVE
EBV VCA IgG: 3.71 — ABNORMAL HIGH
EBV VCA IgM: 0.1
Eosinophils Absolute: 0.1 10*3/uL — ABNORMAL LOW (ref 0.2–0.7)
Hemoglobin: 14.7 g/dL (ref 13.0–17.0)
Lymphs Abs: 1.6 10*3/uL (ref 0.7–4.0)
MCHC: 33.3 g/dL (ref 30.0–36.0)
Monocytes Relative: 12 % (ref 3–12)
Neutro Abs: 2.4 10*3/uL (ref 1.7–7.7)
RBC: 4.84 M/uL (ref 4.22–5.81)
Toxoplasma IgG Ratio: 0.9
WBC: 4.7 10*3/uL (ref 4.0–10.5)

## 2007-11-15 ENCOUNTER — Ambulatory Visit (HOSPITAL_COMMUNITY): Admission: RE | Admit: 2007-11-15 | Discharge: 2007-11-15 | Payer: Self-pay | Admitting: Infectious Diseases

## 2007-11-15 ENCOUNTER — Ambulatory Visit: Payer: Self-pay | Admitting: Infectious Diseases

## 2007-11-15 DIAGNOSIS — J209 Acute bronchitis, unspecified: Secondary | ICD-10-CM

## 2007-11-15 LAB — CONVERTED CEMR LAB
Basophils Absolute: 0 10*3/uL (ref 0.0–0.1)
HCT: 41.1 % (ref 39.0–52.0)
Hemoglobin: 13.9 g/dL (ref 13.0–17.0)
Lymphocytes Relative: 18 % (ref 12–46)
MCV: 90.9 fL (ref 78.0–100.0)
RBC: 4.52 M/uL (ref 4.22–5.81)

## 2007-11-24 ENCOUNTER — Ambulatory Visit: Payer: Self-pay | Admitting: Infectious Diseases

## 2007-11-24 DIAGNOSIS — J309 Allergic rhinitis, unspecified: Secondary | ICD-10-CM | POA: Insufficient documentation

## 2007-12-27 ENCOUNTER — Ambulatory Visit: Payer: Self-pay | Admitting: Infectious Diseases

## 2007-12-31 ENCOUNTER — Encounter: Payer: Self-pay | Admitting: Infectious Diseases

## 2008-02-10 ENCOUNTER — Ambulatory Visit: Payer: Self-pay | Admitting: Oncology

## 2008-02-14 LAB — COMPREHENSIVE METABOLIC PANEL
BUN: 25 mg/dL — ABNORMAL HIGH (ref 6–23)
CO2: 25 mEq/L (ref 19–32)
Glucose, Bld: 122 mg/dL — ABNORMAL HIGH (ref 70–99)
Sodium: 144 mEq/L (ref 135–145)
Total Bilirubin: 0.5 mg/dL (ref 0.3–1.2)
Total Protein: 7.3 g/dL (ref 6.0–8.3)

## 2008-02-14 LAB — CBC WITH DIFFERENTIAL/PLATELET
Basophils Absolute: 0 10*3/uL (ref 0.0–0.1)
Eosinophils Absolute: 0.2 10*3/uL (ref 0.0–0.5)
HCT: 39.1 % (ref 38.7–49.9)
HGB: 13.9 g/dL (ref 13.0–17.1)
LYMPH%: 23.3 % (ref 14.0–48.0)
MCV: 88.1 fL (ref 81.6–98.0)
MONO#: 0.7 10*3/uL (ref 0.1–0.9)
MONO%: 10.3 % (ref 0.0–13.0)
NEUT#: 4 10*3/uL (ref 1.5–6.5)
NEUT%: 62.3 % (ref 40.0–75.0)
Platelets: 180 10*3/uL (ref 145–400)
WBC: 6.5 10*3/uL (ref 4.0–10.0)

## 2008-02-14 LAB — LACTATE DEHYDROGENASE: LDH: 135 U/L (ref 94–250)

## 2008-02-28 ENCOUNTER — Ambulatory Visit (HOSPITAL_COMMUNITY): Admission: RE | Admit: 2008-02-28 | Discharge: 2008-02-28 | Payer: Self-pay | Admitting: Oncology

## 2008-05-18 ENCOUNTER — Ambulatory Visit: Payer: Self-pay | Admitting: Oncology

## 2008-05-23 LAB — CBC WITH DIFFERENTIAL/PLATELET
BASO%: 0.4 % (ref 0.0–2.0)
Basophils Absolute: 0 10*3/uL (ref 0.0–0.1)
HCT: 39.9 % (ref 38.7–49.9)
HGB: 14.1 g/dL (ref 13.0–17.1)
MONO#: 0.9 10*3/uL (ref 0.1–0.9)
NEUT%: 52 % (ref 40.0–75.0)
RDW: 14 % (ref 11.2–14.6)
WBC: 5.3 10*3/uL (ref 4.0–10.0)
lymph#: 1.5 10*3/uL (ref 0.9–3.3)

## 2008-05-25 LAB — COMPREHENSIVE METABOLIC PANEL
ALT: 28 U/L (ref 0–53)
Albumin: 4.8 g/dL (ref 3.5–5.2)
BUN: 20 mg/dL (ref 6–23)
CO2: 21 mEq/L (ref 19–32)
Calcium: 9.2 mg/dL (ref 8.4–10.5)
Chloride: 106 mEq/L (ref 96–112)
Creatinine, Ser: 1.26 mg/dL (ref 0.40–1.50)
Potassium: 4.2 mEq/L (ref 3.5–5.3)

## 2008-05-25 LAB — SPEP & IFE WITH QIG
Alpha-1-Globulin: 5 % — ABNORMAL HIGH (ref 2.9–4.9)
Alpha-2-Globulin: 12.9 % — ABNORMAL HIGH (ref 7.1–11.8)
Beta 2: 5.5 % (ref 3.2–6.5)
Gamma Globulin: 9.3 % — ABNORMAL LOW (ref 11.1–18.8)
IgG (Immunoglobin G), Serum: 637 mg/dL — ABNORMAL LOW (ref 694–1618)

## 2008-05-25 LAB — LACTATE DEHYDROGENASE: LDH: 154 U/L (ref 94–250)

## 2008-11-23 ENCOUNTER — Ambulatory Visit: Payer: Self-pay | Admitting: Oncology

## 2008-11-27 ENCOUNTER — Ambulatory Visit (HOSPITAL_COMMUNITY): Admission: RE | Admit: 2008-11-27 | Discharge: 2008-11-27 | Payer: Self-pay | Admitting: Oncology

## 2008-11-27 LAB — CBC & DIFF AND RETIC
BASO%: 0.3 % (ref 0.0–2.0)
EOS%: 0.9 % (ref 0.0–7.0)
HCT: 41.9 % (ref 38.7–49.9)
IRF: 0.36 (ref 0.070–0.380)
LYMPH%: 14.1 % (ref 14.0–48.0)
MCH: 31.2 pg (ref 28.0–33.4)
MCHC: 34.7 g/dL (ref 32.0–35.9)
NEUT%: 74.8 % (ref 40.0–75.0)
RBC: 4.65 10*6/uL (ref 4.20–5.71)
Retic %: 1.2 % (ref 0.7–2.3)
lymph#: 1.2 10*3/uL (ref 0.9–3.3)

## 2008-11-27 LAB — COMPREHENSIVE METABOLIC PANEL
CO2: 25 mEq/L (ref 19–32)
Creatinine, Ser: 2.01 mg/dL — ABNORMAL HIGH (ref 0.40–1.50)
Glucose, Bld: 119 mg/dL — ABNORMAL HIGH (ref 70–99)
Total Bilirubin: 0.7 mg/dL (ref 0.3–1.2)

## 2008-11-27 LAB — LACTATE DEHYDROGENASE: LDH: 125 U/L (ref 94–250)

## 2008-11-29 LAB — SPEP & IFE WITH QIG
Gamma Globulin: 9.3 % — ABNORMAL LOW (ref 11.1–18.8)
IgG (Immunoglobin G), Serum: 778 mg/dL (ref 694–1618)
IgM, Serum: 84 mg/dL (ref 60–263)

## 2008-12-05 LAB — BASIC METABOLIC PANEL
CO2: 24 mEq/L (ref 19–32)
Calcium: 9.6 mg/dL (ref 8.4–10.5)
Sodium: 139 mEq/L (ref 135–145)

## 2008-12-10 ENCOUNTER — Emergency Department (HOSPITAL_COMMUNITY): Admission: EM | Admit: 2008-12-10 | Discharge: 2008-12-10 | Payer: Self-pay | Admitting: Emergency Medicine

## 2008-12-12 ENCOUNTER — Inpatient Hospital Stay (HOSPITAL_COMMUNITY): Admission: AD | Admit: 2008-12-12 | Discharge: 2008-12-15 | Payer: Self-pay | Admitting: Oncology

## 2008-12-12 ENCOUNTER — Ambulatory Visit: Payer: Self-pay | Admitting: Internal Medicine

## 2008-12-12 LAB — CBC WITH DIFFERENTIAL/PLATELET
Basophils Absolute: 0 10*3/uL (ref 0.0–0.1)
EOS%: 0.4 % (ref 0.0–7.0)
Eosinophils Absolute: 0 10*3/uL (ref 0.0–0.5)
HCT: 39.1 % (ref 38.7–49.9)
HGB: 13.4 g/dL (ref 13.0–17.1)
MCH: 29.7 pg (ref 28.0–33.4)
MONO#: 0.7 10*3/uL (ref 0.1–0.9)
NEUT%: 64.3 % (ref 40.0–75.0)
lymph#: 0.8 10*3/uL — ABNORMAL LOW (ref 0.9–3.3)

## 2008-12-13 ENCOUNTER — Ambulatory Visit: Payer: Self-pay | Admitting: Oncology

## 2008-12-19 ENCOUNTER — Ambulatory Visit (HOSPITAL_COMMUNITY): Admission: RE | Admit: 2008-12-19 | Discharge: 2008-12-19 | Payer: Self-pay | Admitting: Oncology

## 2008-12-19 LAB — COMPREHENSIVE METABOLIC PANEL
BUN: 20 mg/dL (ref 6–23)
CO2: 24 mEq/L (ref 19–32)
Calcium: 9.6 mg/dL (ref 8.4–10.5)
Chloride: 105 mEq/L (ref 96–112)
Creatinine, Ser: 1.55 mg/dL — ABNORMAL HIGH (ref 0.40–1.50)
Glucose, Bld: 99 mg/dL (ref 70–99)

## 2009-01-08 ENCOUNTER — Ambulatory Visit: Payer: Self-pay | Admitting: Oncology

## 2009-01-08 LAB — CBC WITH DIFFERENTIAL/PLATELET
BASO%: 0.4 % (ref 0.0–2.0)
HCT: 42.1 % (ref 38.7–49.9)
LYMPH%: 27.3 % (ref 14.0–48.0)
MCH: 30.2 pg (ref 28.0–33.4)
MCHC: 34.2 g/dL (ref 32.0–35.9)
MCV: 88.4 fL (ref 81.6–98.0)
MONO#: 0.8 10*3/uL (ref 0.1–0.9)
MONO%: 12.7 % (ref 0.0–13.0)
NEUT%: 58 % (ref 40.0–75.0)
Platelets: 170 10*3/uL (ref 145–400)
RBC: 4.76 10*6/uL (ref 4.20–5.71)
WBC: 6.6 10*3/uL (ref 4.0–10.0)

## 2009-01-08 LAB — BASIC METABOLIC PANEL
Calcium: 9.3 mg/dL (ref 8.4–10.5)
Sodium: 142 mEq/L (ref 135–145)

## 2009-02-05 LAB — BASIC METABOLIC PANEL
BUN: 24 mg/dL — ABNORMAL HIGH (ref 6–23)
Calcium: 9.3 mg/dL (ref 8.4–10.5)
Creatinine, Ser: 1.43 mg/dL (ref 0.40–1.50)
Glucose, Bld: 157 mg/dL — ABNORMAL HIGH (ref 70–99)
Sodium: 141 mEq/L (ref 135–145)

## 2009-02-05 LAB — CBC WITH DIFFERENTIAL/PLATELET
Basophils Absolute: 0 10*3/uL (ref 0.0–0.1)
Eosinophils Absolute: 0.1 10*3/uL (ref 0.0–0.5)
LYMPH%: 29.6 % (ref 14.0–48.0)
MCV: 87.9 fL (ref 81.6–98.0)
MONO%: 11.6 % (ref 0.0–13.0)
NEUT#: 3.2 10*3/uL (ref 1.5–6.5)
Platelets: 191 10*3/uL (ref 145–400)
RBC: 4.24 10*6/uL (ref 4.20–5.71)

## 2009-03-01 ENCOUNTER — Ambulatory Visit: Payer: Self-pay | Admitting: Oncology

## 2009-03-05 LAB — BASIC METABOLIC PANEL
BUN: 19 mg/dL (ref 6–23)
Chloride: 108 mEq/L (ref 96–112)
Sodium: 141 mEq/L (ref 135–145)

## 2009-03-05 LAB — CBC WITH DIFFERENTIAL/PLATELET
EOS%: 1.4 % (ref 0.0–7.0)
LYMPH%: 29.8 % (ref 14.0–49.0)
MCH: 30.1 pg (ref 27.2–33.4)
MCHC: 34.3 g/dL (ref 32.0–36.0)
MCV: 87.8 fL (ref 79.3–98.0)
MONO%: 13.6 % (ref 0.0–14.0)
Platelets: 169 10*3/uL (ref 140–400)
RBC: 4.52 10*6/uL (ref 4.20–5.82)
RDW: 15.2 % — ABNORMAL HIGH (ref 11.0–14.6)
nRBC: 0 % (ref 0–0)

## 2009-04-09 LAB — CBC WITH DIFFERENTIAL/PLATELET
Basophils Absolute: 0 10*3/uL (ref 0.0–0.1)
Eosinophils Absolute: 0.1 10*3/uL (ref 0.0–0.5)
HGB: 13.7 g/dL (ref 13.0–17.1)
MCV: 87.3 fL (ref 79.3–98.0)
MONO#: 0.7 10*3/uL (ref 0.1–0.9)
MONO%: 13.7 % (ref 0.0–14.0)
NEUT#: 2.6 10*3/uL (ref 1.5–6.5)
RBC: 4.49 10*6/uL (ref 4.20–5.82)
RDW: 14 % (ref 11.0–14.6)
WBC: 4.7 10*3/uL (ref 4.0–10.3)
lymph#: 1.3 10*3/uL (ref 0.9–3.3)

## 2009-04-11 LAB — COMPREHENSIVE METABOLIC PANEL
Albumin: 4.6 g/dL (ref 3.5–5.2)
Alkaline Phosphatase: 29 U/L — ABNORMAL LOW (ref 39–117)
BUN: 21 mg/dL (ref 6–23)
Calcium: 9.4 mg/dL (ref 8.4–10.5)
Chloride: 108 mEq/L (ref 96–112)
Glucose, Bld: 99 mg/dL (ref 70–99)
Potassium: 4.8 mEq/L (ref 3.5–5.3)
Sodium: 141 mEq/L (ref 135–145)
Total Protein: 7.4 g/dL (ref 6.0–8.3)

## 2009-04-11 LAB — SPEP & IFE WITH QIG
Alpha-2-Globulin: 11.8 % (ref 7.1–11.8)
Beta 2: 5.5 % (ref 3.2–6.5)
Beta Globulin: 7.3 % — ABNORMAL HIGH (ref 4.7–7.2)
Gamma Globulin: 11.9 % (ref 11.1–18.8)
IgA: 221 mg/dL (ref 68–378)
IgG (Immunoglobin G), Serum: 955 mg/dL (ref 694–1618)
Total Protein, Serum Electrophoresis: 7.4 g/dL (ref 6.0–8.3)

## 2009-04-11 LAB — KAPPA/LAMBDA LIGHT CHAINS: Kappa free light chain: 1.3 mg/dL (ref 0.33–1.94)

## 2009-05-03 ENCOUNTER — Ambulatory Visit: Payer: Self-pay | Admitting: Oncology

## 2009-05-07 LAB — CBC WITH DIFFERENTIAL/PLATELET
Basophils Absolute: 0 10*3/uL (ref 0.0–0.1)
Eosinophils Absolute: 0.1 10*3/uL (ref 0.0–0.5)
HGB: 13.9 g/dL (ref 13.0–17.1)
LYMPH%: 27.5 % (ref 14.0–49.0)
MCV: 89.8 fL (ref 79.3–98.0)
MONO#: 0.6 10*3/uL (ref 0.1–0.9)
MONO%: 13.2 % (ref 0.0–14.0)
NEUT#: 2.6 10*3/uL (ref 1.5–6.5)
Platelets: 169 10*3/uL (ref 140–400)
RBC: 4.53 10*6/uL (ref 4.20–5.82)
WBC: 4.5 10*3/uL (ref 4.0–10.3)

## 2009-05-07 LAB — BASIC METABOLIC PANEL
BUN: 18 mg/dL (ref 6–23)
Calcium: 9.3 mg/dL (ref 8.4–10.5)
Chloride: 106 mEq/L (ref 96–112)
Creatinine, Ser: 1.59 mg/dL — ABNORMAL HIGH (ref 0.40–1.50)

## 2009-05-28 LAB — CBC WITH DIFFERENTIAL/PLATELET
BASO%: 0.5 % (ref 0.0–2.0)
Eosinophils Absolute: 0.1 10*3/uL (ref 0.0–0.5)
LYMPH%: 22.4 % (ref 14.0–49.0)
MONO#: 0.8 10*3/uL (ref 0.1–0.9)
NEUT#: 4 10*3/uL (ref 1.5–6.5)
Platelets: 159 10*3/uL (ref 140–400)
RBC: 4.37 10*6/uL (ref 4.20–5.82)
WBC: 6.3 10*3/uL (ref 4.0–10.3)
lymph#: 1.4 10*3/uL (ref 0.9–3.3)

## 2009-05-30 LAB — COMPREHENSIVE METABOLIC PANEL
ALT: 30 U/L (ref 0–53)
Albumin: 4.5 g/dL (ref 3.5–5.2)
CO2: 24 mEq/L (ref 19–32)
Calcium: 9.7 mg/dL (ref 8.4–10.5)
Chloride: 106 mEq/L (ref 96–112)
Glucose, Bld: 87 mg/dL (ref 70–99)
Potassium: 4.3 mEq/L (ref 3.5–5.3)
Sodium: 140 mEq/L (ref 135–145)
Total Bilirubin: 0.5 mg/dL (ref 0.3–1.2)
Total Protein: 7.2 g/dL (ref 6.0–8.3)

## 2009-05-30 LAB — SPEP & IFE WITH QIG
Albumin ELP: 61.3 % (ref 55.8–66.1)
Alpha-1-Globulin: 3.7 % (ref 2.9–4.9)
Alpha-2-Globulin: 8.5 % (ref 7.1–11.8)
Beta Globulin: 7.3 % — ABNORMAL HIGH (ref 4.7–7.2)
Total Protein, Serum Electrophoresis: 7.2 g/dL (ref 6.0–8.3)

## 2009-05-30 LAB — LACTATE DEHYDROGENASE: LDH: 143 U/L (ref 94–250)

## 2009-05-30 LAB — KAPPA/LAMBDA LIGHT CHAINS: Kappa free light chain: 0.94 mg/dL (ref 0.33–1.94)

## 2009-06-05 ENCOUNTER — Ambulatory Visit (HOSPITAL_COMMUNITY): Admission: RE | Admit: 2009-06-05 | Discharge: 2009-06-05 | Payer: Self-pay | Admitting: Oncology

## 2009-07-05 ENCOUNTER — Ambulatory Visit: Payer: Self-pay | Admitting: Oncology

## 2009-07-09 LAB — BASIC METABOLIC PANEL
BUN: 22 mg/dL (ref 6–23)
CO2: 19 mEq/L (ref 19–32)
Chloride: 107 mEq/L (ref 96–112)
Glucose, Bld: 99 mg/dL (ref 70–99)
Potassium: 4.4 mEq/L (ref 3.5–5.3)
Sodium: 138 mEq/L (ref 135–145)

## 2009-08-06 ENCOUNTER — Ambulatory Visit: Payer: Self-pay | Admitting: Oncology

## 2009-08-06 LAB — CBC WITH DIFFERENTIAL/PLATELET
Basophils Absolute: 0 10*3/uL (ref 0.0–0.1)
Eosinophils Absolute: 0.1 10*3/uL (ref 0.0–0.5)
HCT: 39.9 % (ref 38.4–49.9)
HGB: 13.5 g/dL (ref 13.0–17.1)
MCH: 30.4 pg (ref 27.2–33.4)
MONO#: 0.6 10*3/uL (ref 0.1–0.9)
NEUT#: 2.5 10*3/uL (ref 1.5–6.5)
NEUT%: 54.3 % (ref 39.0–75.0)
RDW: 14.4 % (ref 11.0–14.6)
lymph#: 1.3 10*3/uL (ref 0.9–3.3)

## 2009-08-08 LAB — COMPREHENSIVE METABOLIC PANEL
ALT: 26 U/L (ref 0–53)
AST: 21 U/L (ref 0–37)
Alkaline Phosphatase: 27 U/L — ABNORMAL LOW (ref 39–117)
Calcium: 9.7 mg/dL (ref 8.4–10.5)
Chloride: 107 mEq/L (ref 96–112)
Creatinine, Ser: 1.57 mg/dL — ABNORMAL HIGH (ref 0.40–1.50)
Total Bilirubin: 0.5 mg/dL (ref 0.3–1.2)

## 2009-08-08 LAB — KAPPA/LAMBDA LIGHT CHAINS
Kappa free light chain: <0.57 mg/dL (ref 0.33–1.94)
Lambda Free Lght Chn: 0.91 mg/dL (ref 0.57–2.63)

## 2009-08-08 LAB — SPEP & IFE WITH QIG
Albumin ELP: 61.8 % (ref 55.8–66.1)
Alpha-1-Globulin: 3.8 % (ref 2.9–4.9)
Beta 2: 4.7 % (ref 3.2–6.5)
Beta Globulin: 7.6 % — ABNORMAL HIGH (ref 4.7–7.2)
IgA: 225 mg/dL (ref 68–378)
IgM, Serum: 64 mg/dL (ref 60–263)
Total Protein, Serum Electrophoresis: 7.3 g/dL (ref 6.0–8.3)

## 2009-09-03 ENCOUNTER — Ambulatory Visit: Payer: Self-pay | Admitting: Oncology

## 2009-09-03 LAB — BASIC METABOLIC PANEL
BUN: 25 mg/dL — ABNORMAL HIGH (ref 6–23)
CO2: 22 mEq/L (ref 19–32)
Calcium: 9.2 mg/dL (ref 8.4–10.5)
Creatinine, Ser: 1.52 mg/dL — ABNORMAL HIGH (ref 0.40–1.50)

## 2009-10-01 LAB — CBC WITH DIFFERENTIAL/PLATELET
Basophils Absolute: 0 10*3/uL (ref 0.0–0.1)
Eosinophils Absolute: 0.1 10*3/uL (ref 0.0–0.5)
HGB: 14.2 g/dL (ref 13.0–17.1)
MCV: 91.1 fL (ref 79.3–98.0)
MONO#: 0.9 10*3/uL (ref 0.1–0.9)
MONO%: 10.3 % (ref 0.0–14.0)
NEUT#: 6.1 10*3/uL (ref 1.5–6.5)
RDW: 14.3 % (ref 11.0–14.6)
WBC: 8.5 10*3/uL (ref 4.0–10.3)

## 2009-10-03 LAB — KAPPA/LAMBDA LIGHT CHAINS
Kappa:Lambda Ratio: 0.56 (ref 0.26–1.65)
Lambda Free Lght Chn: 1.26 mg/dL (ref 0.57–2.63)

## 2009-10-03 LAB — SPEP & IFE WITH QIG
Alpha-1-Globulin: 3.9 % (ref 2.9–4.9)
Beta 2: 3.6 % (ref 3.2–6.5)
Gamma Globulin: 12.8 % (ref 11.1–18.8)
IgA: 199 mg/dL (ref 68–378)
IgM, Serum: 75 mg/dL (ref 60–263)

## 2009-10-03 LAB — COMPREHENSIVE METABOLIC PANEL
AST: 21 U/L (ref 0–37)
Alkaline Phosphatase: 25 U/L — ABNORMAL LOW (ref 39–117)
BUN: 23 mg/dL (ref 6–23)
Calcium: 9.2 mg/dL (ref 8.4–10.5)
Creatinine, Ser: 1.44 mg/dL (ref 0.40–1.50)

## 2009-10-15 ENCOUNTER — Ambulatory Visit: Payer: Self-pay | Admitting: Oncology

## 2009-10-22 HISTORY — PX: LASIK: SHX215

## 2009-10-29 LAB — COMPREHENSIVE METABOLIC PANEL
AST: 26 U/L (ref 0–37)
Alkaline Phosphatase: 27 U/L — ABNORMAL LOW (ref 39–117)
BUN: 27 mg/dL — ABNORMAL HIGH (ref 6–23)
Calcium: 9.4 mg/dL (ref 8.4–10.5)
Creatinine, Ser: 1.54 mg/dL — ABNORMAL HIGH (ref 0.40–1.50)

## 2009-10-29 LAB — CBC WITH DIFFERENTIAL/PLATELET
BASO%: 0.4 % (ref 0.0–2.0)
EOS%: 2.1 % (ref 0.0–7.0)
HCT: 41.2 % (ref 38.4–49.9)
LYMPH%: 27.9 % (ref 14.0–49.0)
MCH: 30.5 pg (ref 27.2–33.4)
MCHC: 33.7 g/dL (ref 32.0–36.0)
MCV: 90.5 fL (ref 79.3–98.0)
NEUT%: 57.3 % (ref 39.0–75.0)
Platelets: 168 10*3/uL (ref 140–400)

## 2009-11-22 ENCOUNTER — Ambulatory Visit: Payer: Self-pay | Admitting: Oncology

## 2009-11-28 IMAGING — CR DG CHEST 2V
2 series · 2 of 2 positions shown · non-contrast
Comparison: 12/10/2008.

CLINICAL DATA: Fever.  Cough.

CHEST - 2 VIEW

[w chest pa]
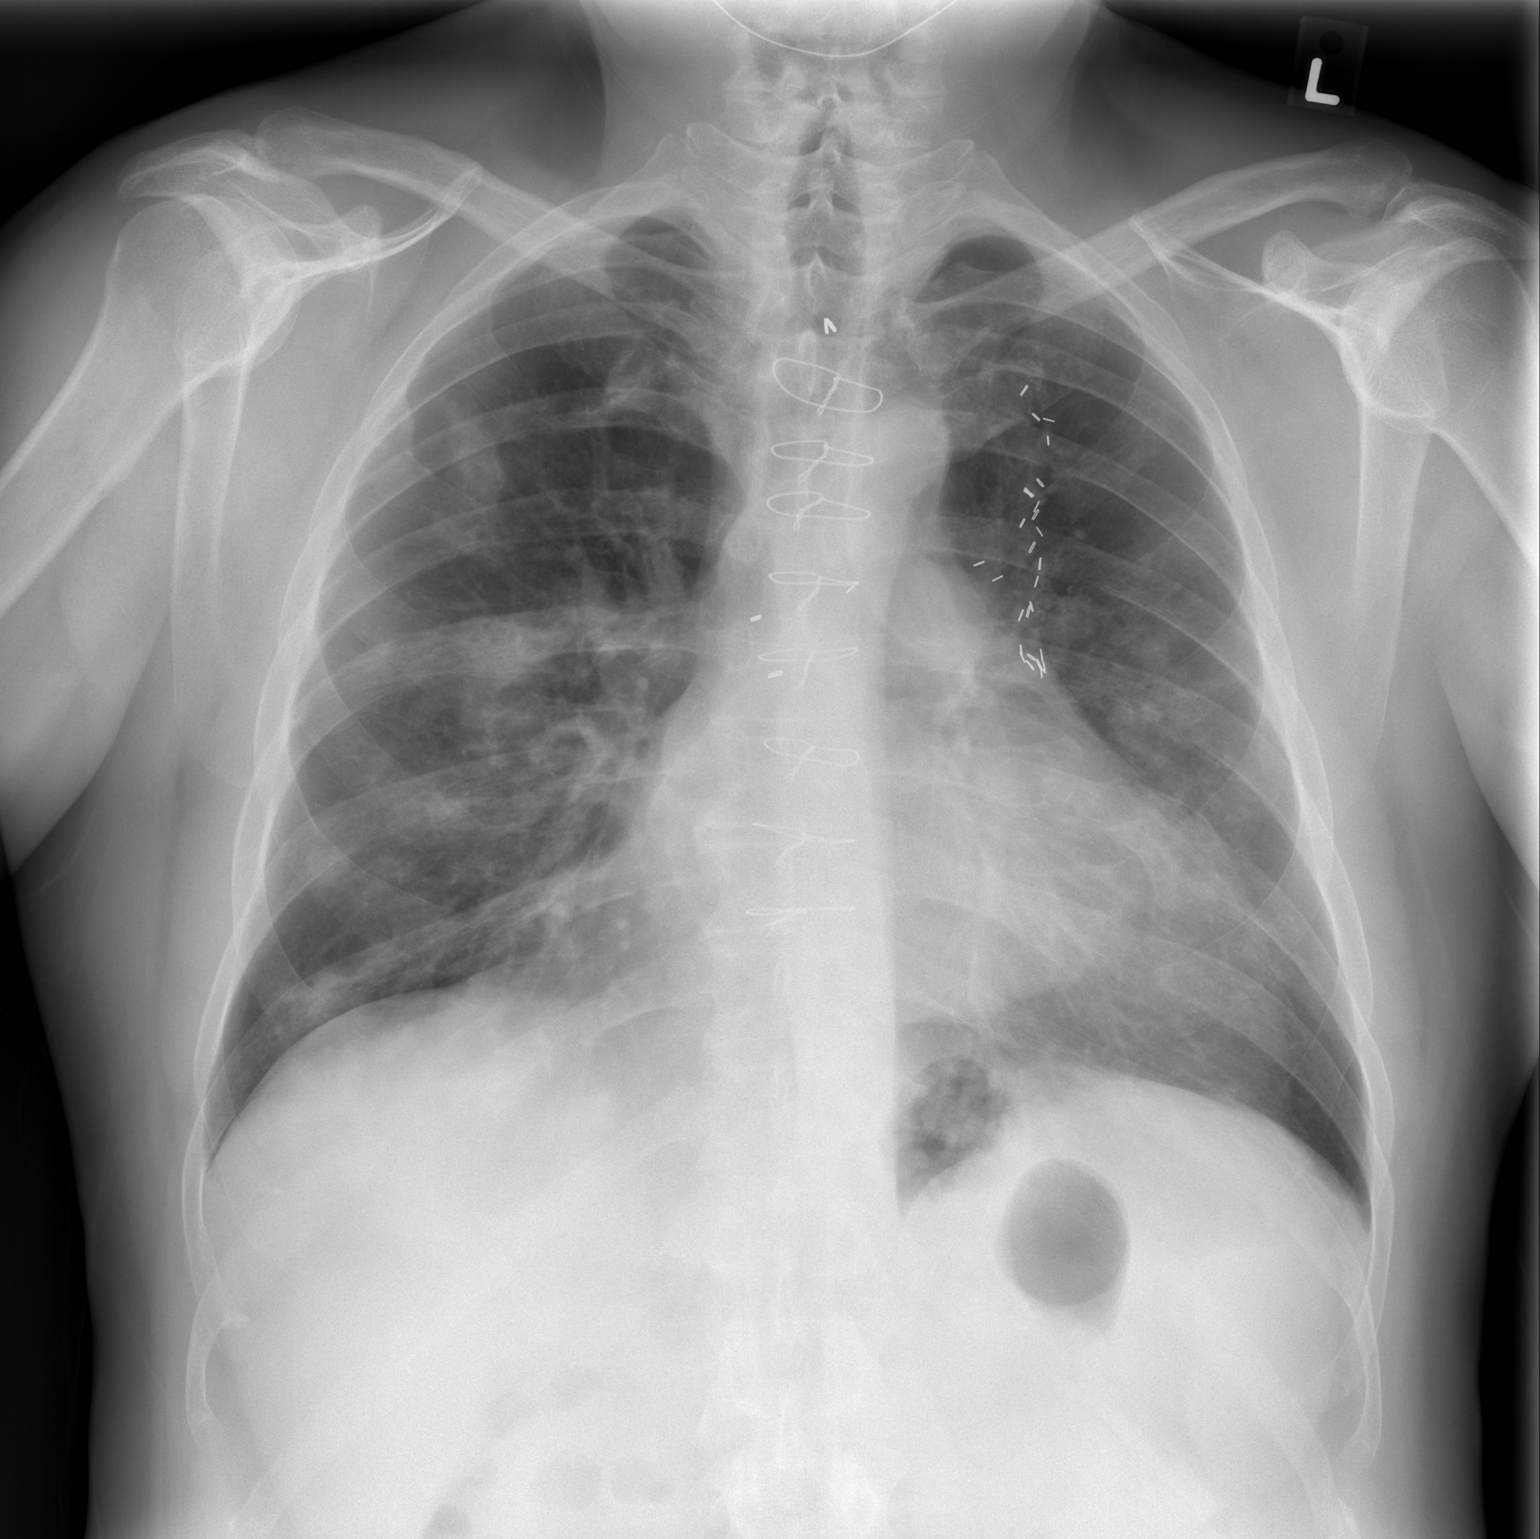

[w chest lat]
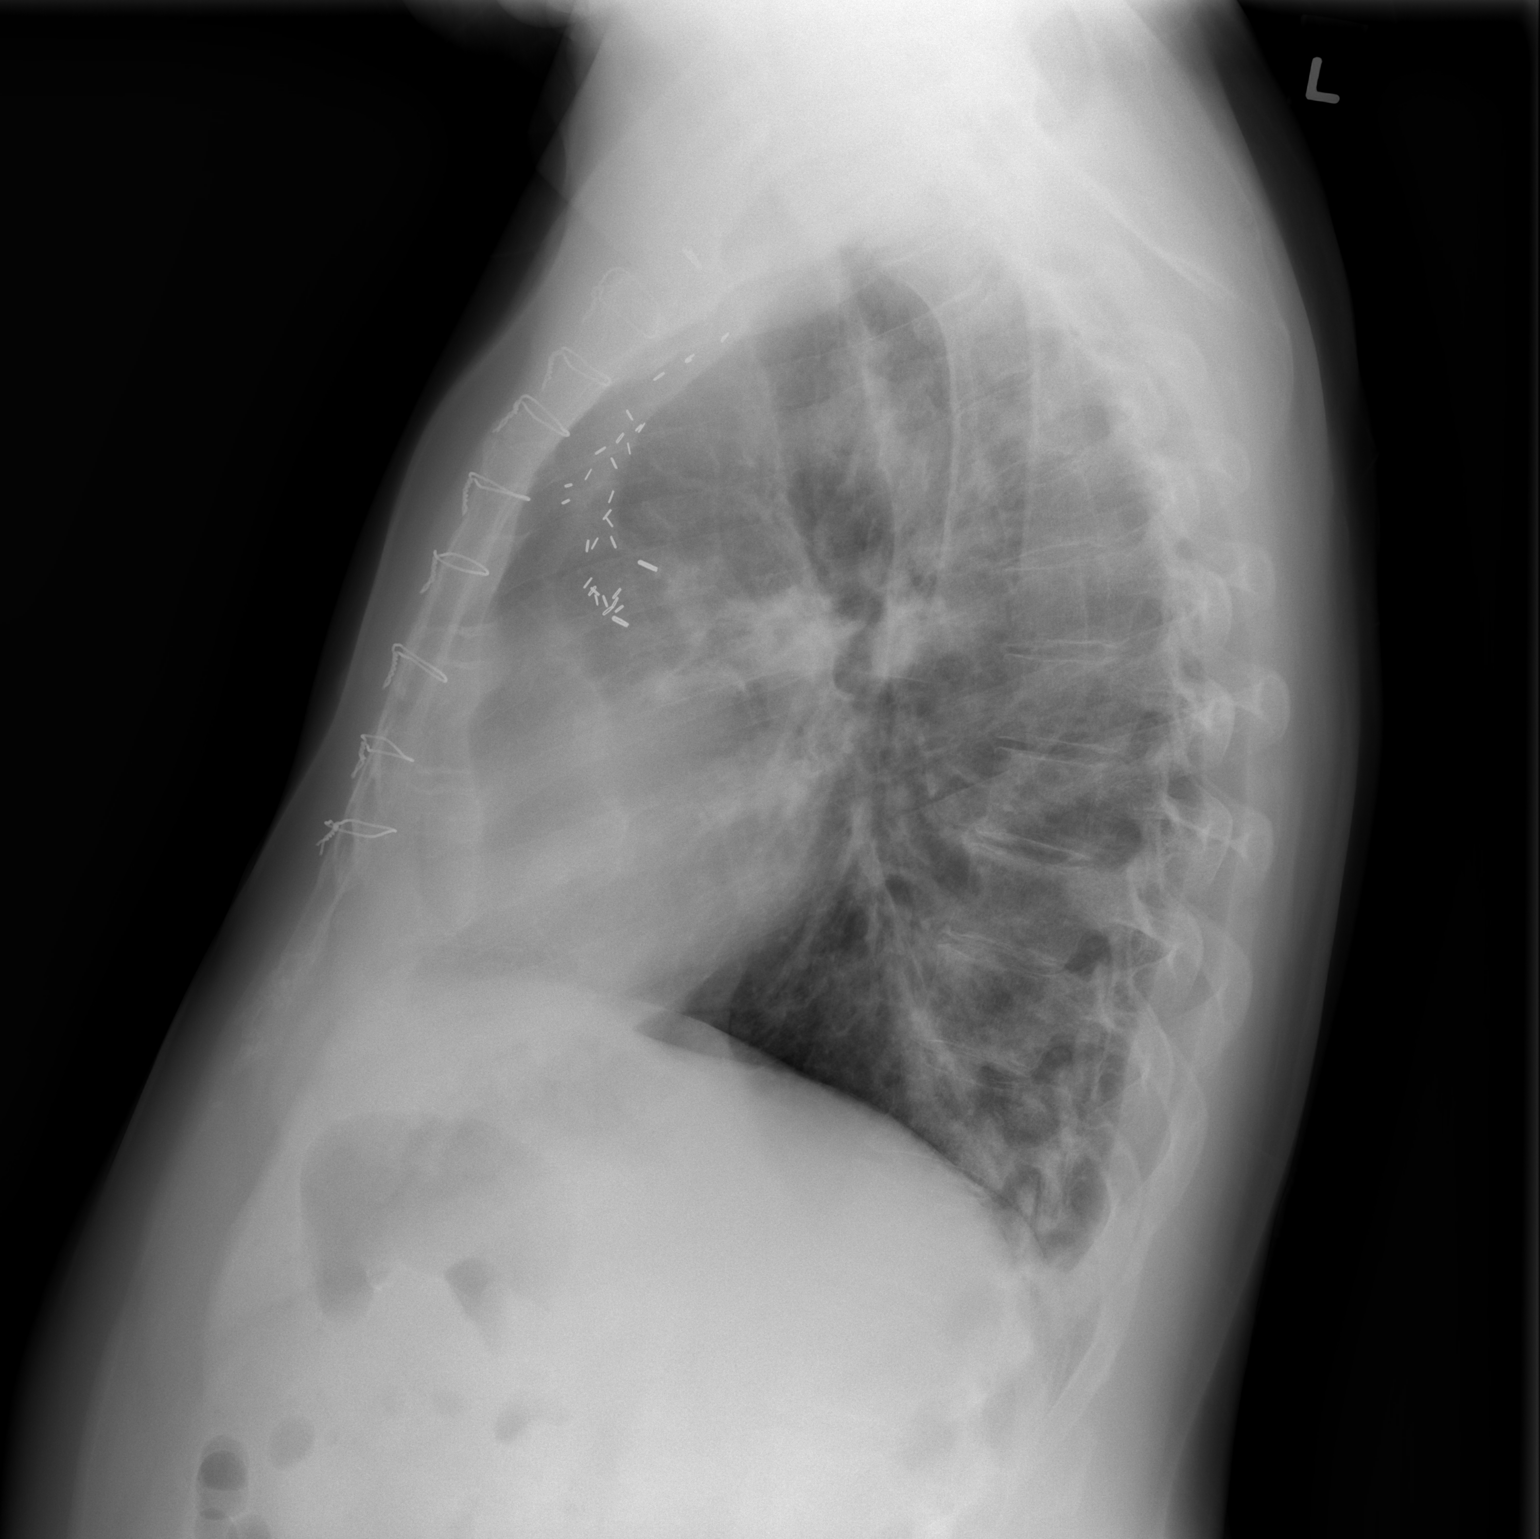

[2 of 2 positions shown; findings below may reference images not displayed]

FINDINGS: The patient has developed patchy bilateral infiltrates
bilaterally since the prior study.  These are in consistent with
bilateral pneumonia.  Negative for effusion.  The heart is enlarged
but there does not appear to be any heart failure and there is no
effusion.
IMPRESSION: Patchy bilateral infiltrates, compatible with pneumonia.

## 2009-12-01 IMAGING — CR DG CHEST 2V
2 series · 2 of 2 positions shown · non-contrast
Comparison: 12/12/2008

CLINICAL DATA: Pneumonia.  Cough. Multiple myeloma.

CHEST - 2 VIEW

[w chest pa]
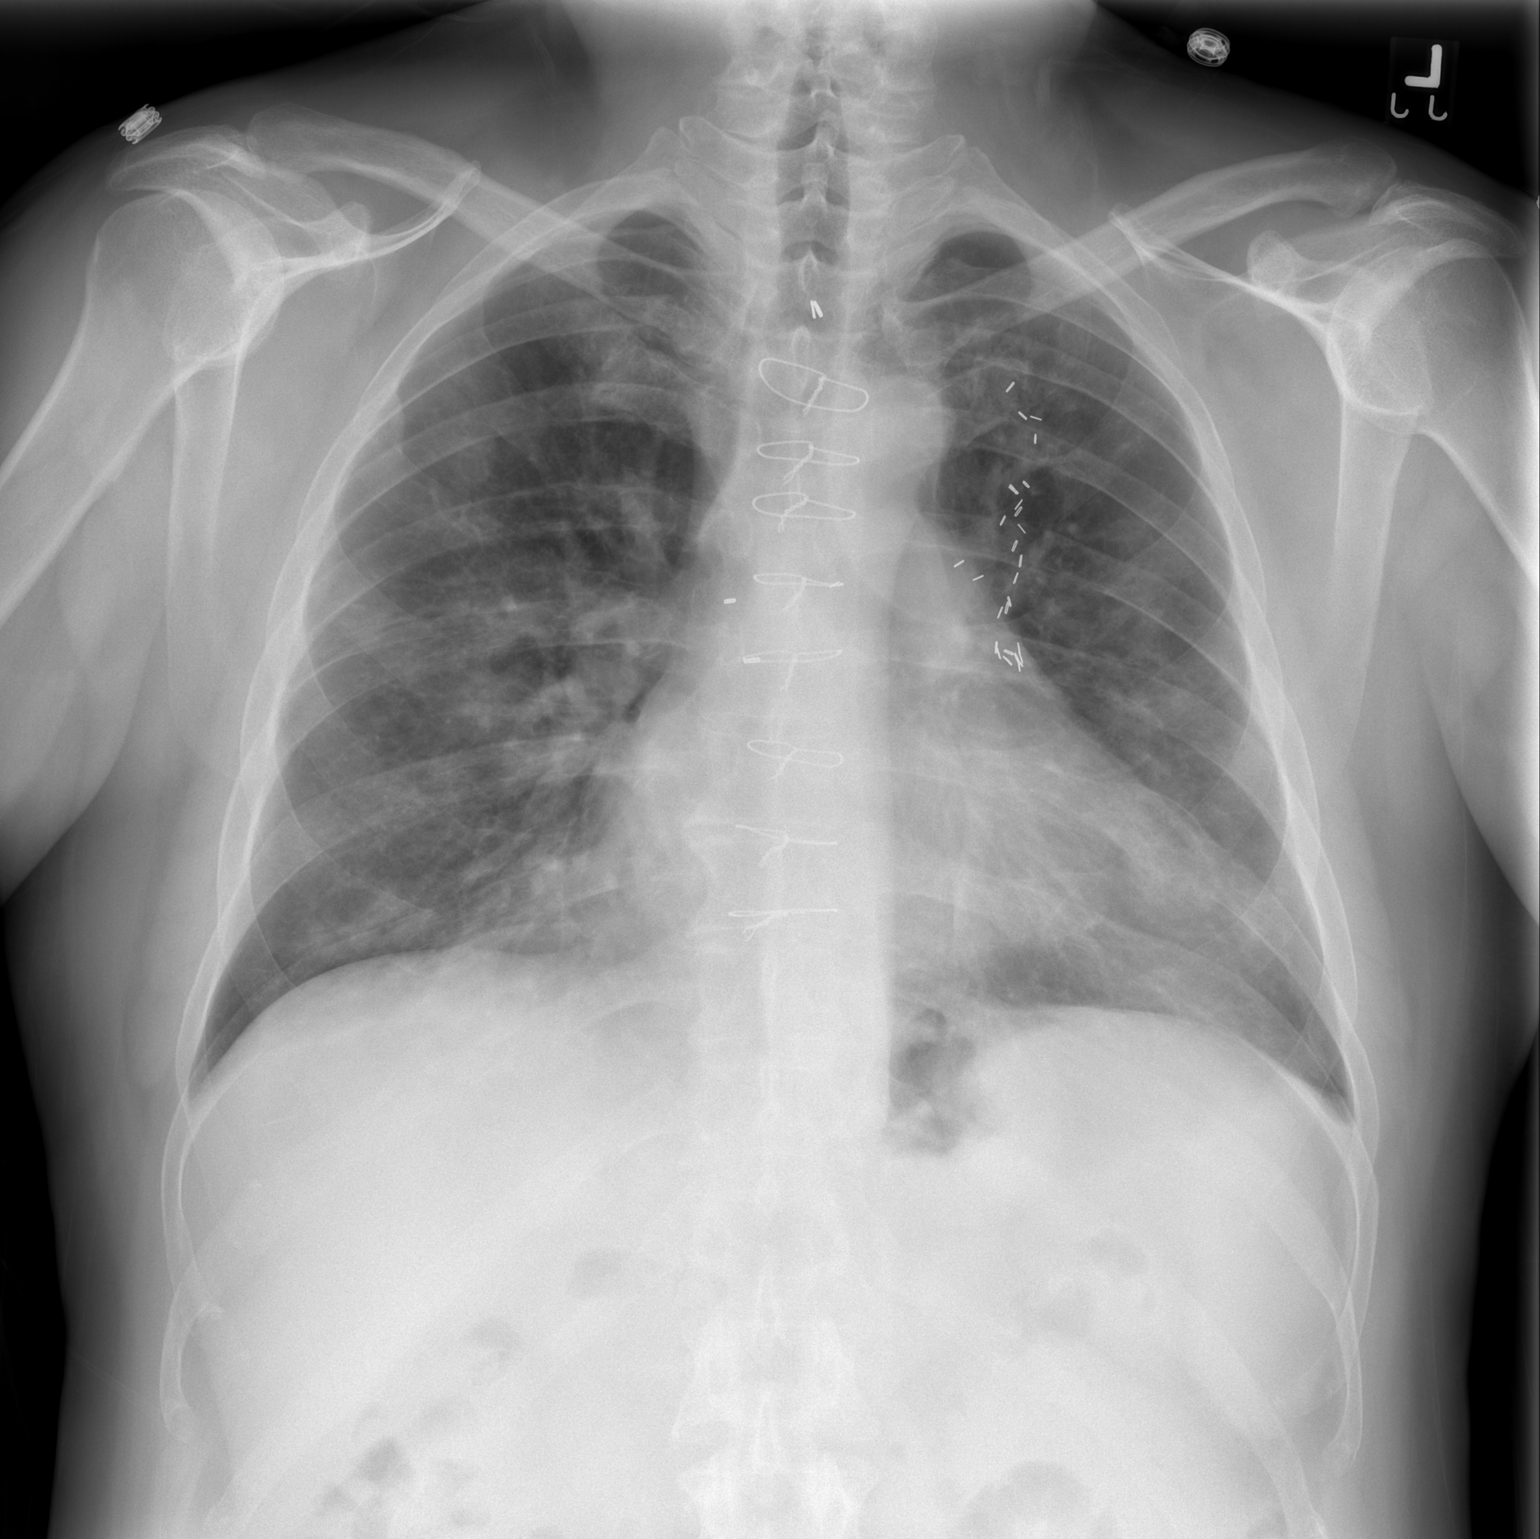

[w chest lat]
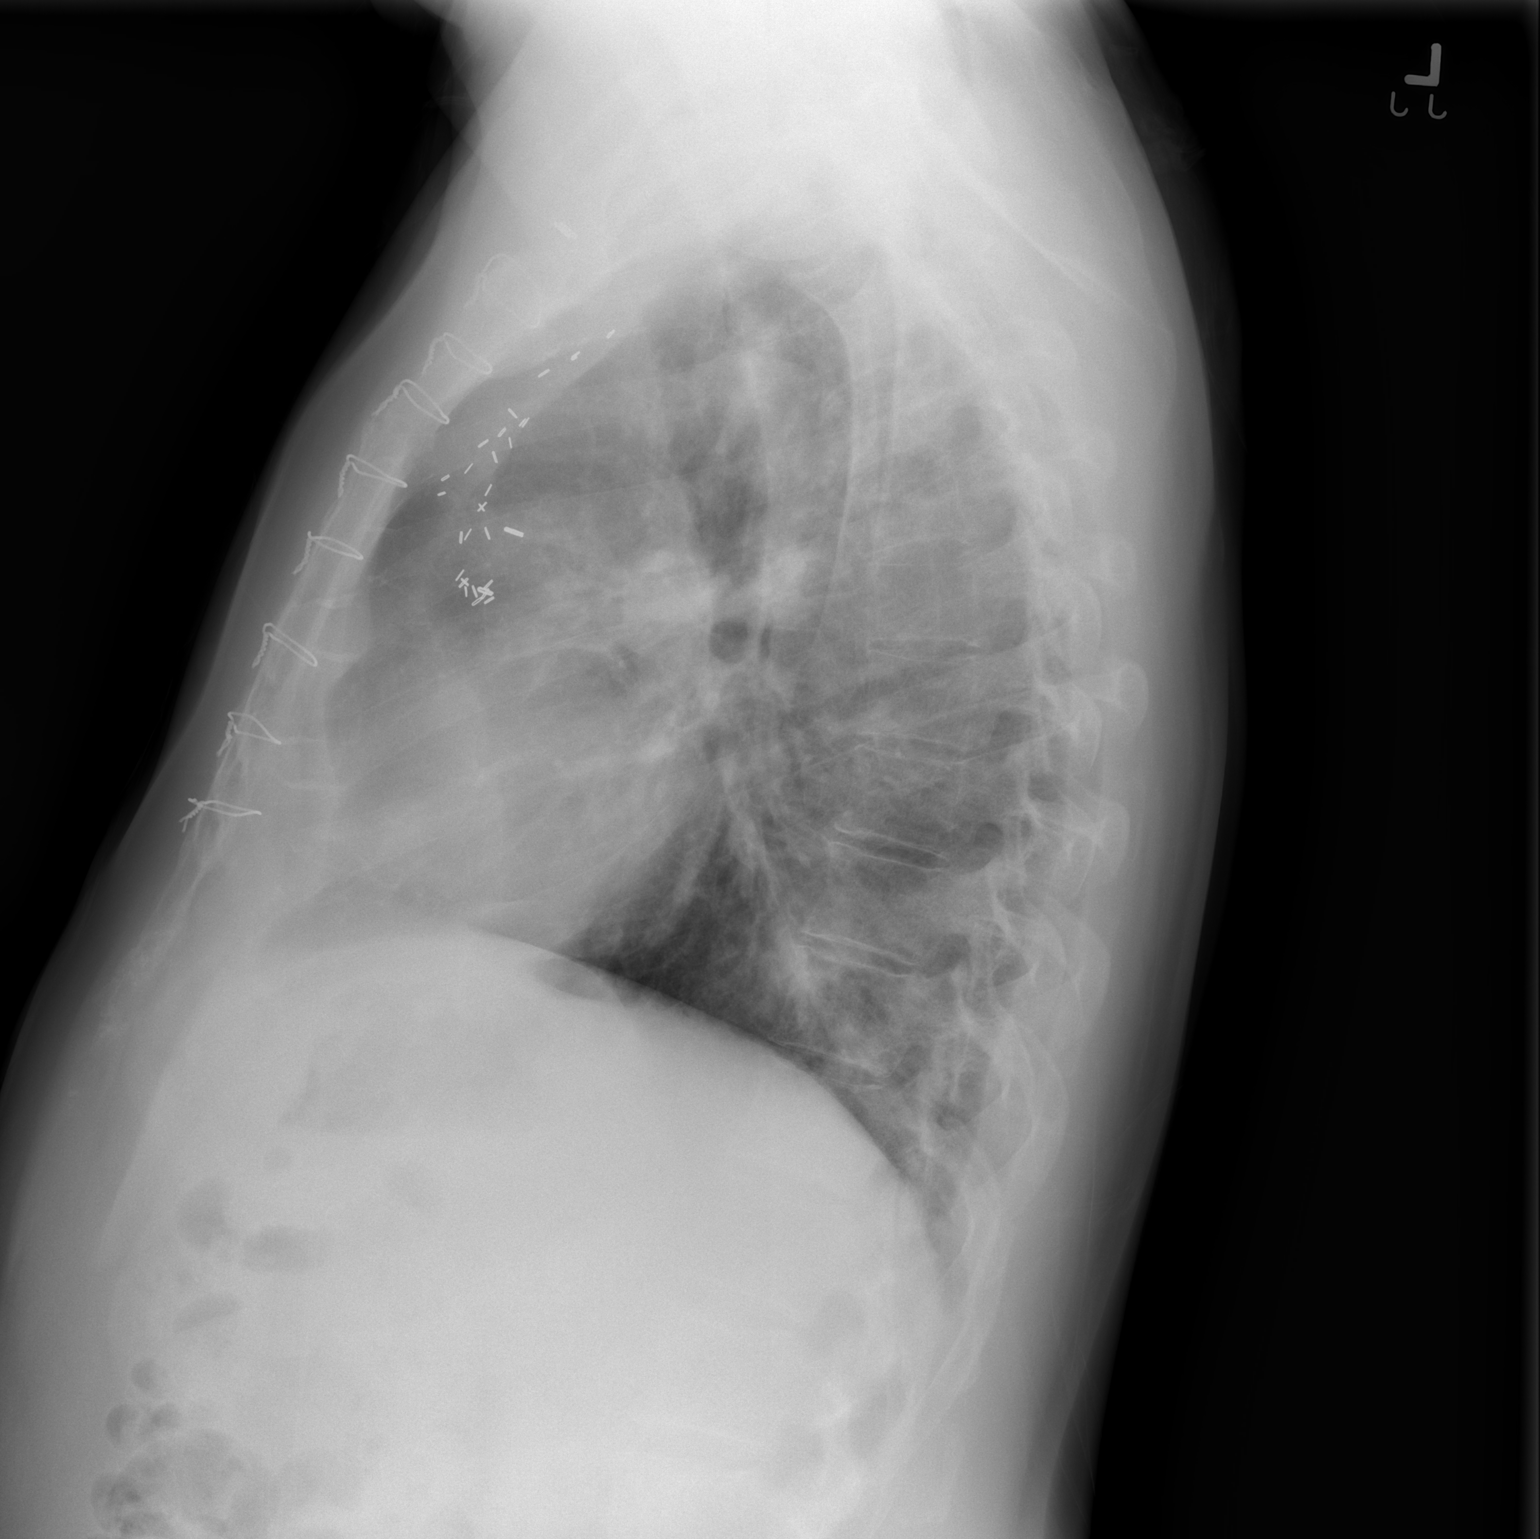

[2 of 2 positions shown; findings below may reference images not displayed]

FINDINGS: Multiple small patchy infiltrates are again noted bilaterally,
slightly improved diffusely.

The heart size and vascularity are normal.  No significant bony
abnormality.  No effusions.
IMPRESSION: Improving patchy bilateral pneumonias.

## 2009-12-26 ENCOUNTER — Ambulatory Visit: Payer: Self-pay | Admitting: Oncology

## 2009-12-26 ENCOUNTER — Other Ambulatory Visit: Payer: Self-pay | Admitting: Oncology

## 2009-12-26 ENCOUNTER — Ambulatory Visit (HOSPITAL_COMMUNITY): Admission: RE | Admit: 2009-12-26 | Discharge: 2009-12-26 | Payer: Self-pay | Admitting: Oncology

## 2009-12-26 LAB — CBC WITH DIFFERENTIAL/PLATELET
BASO%: 0.3 % (ref 0.0–2.0)
Basophils Absolute: 0 10*3/uL (ref 0.0–0.1)
EOS%: 1.8 % (ref 0.0–7.0)
HCT: 39.5 % (ref 38.4–49.9)
HGB: 13.5 g/dL (ref 13.0–17.1)
LYMPH%: 18.6 % (ref 14.0–49.0)
MCH: 31.5 pg (ref 27.2–33.4)
MCHC: 34.2 g/dL (ref 32.0–36.0)
MCV: 92.3 fL (ref 79.3–98.0)
MONO%: 11.2 % (ref 0.0–14.0)
NEUT%: 68.1 % (ref 39.0–75.0)

## 2009-12-26 LAB — BASIC METABOLIC PANEL
BUN: 19 mg/dL (ref 6–23)
Calcium: 9.4 mg/dL (ref 8.4–10.5)
Creatinine, Ser: 1.43 mg/dL (ref 0.40–1.50)

## 2010-02-07 ENCOUNTER — Ambulatory Visit: Payer: Self-pay | Admitting: Oncology

## 2010-02-11 LAB — CBC WITH DIFFERENTIAL/PLATELET
BASO%: 0.5 % (ref 0.0–2.0)
Eosinophils Absolute: 0.1 10*3/uL (ref 0.0–0.5)
LYMPH%: 25.9 % (ref 14.0–49.0)
MCHC: 33.3 g/dL (ref 32.0–36.0)
MCV: 90.7 fL (ref 79.3–98.0)
MONO%: 11.2 % (ref 0.0–14.0)
NEUT#: 3.6 10*3/uL (ref 1.5–6.5)
RBC: 4.63 10*6/uL (ref 4.20–5.82)
RDW: 13.7 % (ref 11.0–14.6)
WBC: 6 10*3/uL (ref 4.0–10.3)

## 2010-02-13 LAB — COMPREHENSIVE METABOLIC PANEL
ALT: 21 U/L (ref 0–53)
AST: 18 U/L (ref 0–37)
Alkaline Phosphatase: 24 U/L — ABNORMAL LOW (ref 39–117)
Creatinine, Ser: 1.49 mg/dL (ref 0.40–1.50)
Total Bilirubin: 0.4 mg/dL (ref 0.3–1.2)

## 2010-02-13 LAB — SPEP & IFE WITH QIG
Beta Globulin: 7.4 % — ABNORMAL HIGH (ref 4.7–7.2)
Gamma Globulin: 12.1 % (ref 11.1–18.8)
IgA: 208 mg/dL (ref 68–378)
IgG (Immunoglobin G), Serum: 756 mg/dL (ref 694–1618)
Total Protein, Serum Electrophoresis: 6.5 g/dL (ref 6.0–8.3)

## 2010-02-13 LAB — KAPPA/LAMBDA LIGHT CHAINS: Kappa:Lambda Ratio: 0.82 (ref 0.26–1.65)

## 2010-04-04 ENCOUNTER — Ambulatory Visit: Payer: Self-pay | Admitting: Oncology

## 2010-04-08 LAB — COMPREHENSIVE METABOLIC PANEL
ALT: 21 U/L (ref 0–53)
AST: 23 U/L (ref 0–37)
Calcium: 9.4 mg/dL (ref 8.4–10.5)
Chloride: 107 mEq/L (ref 96–112)
Creatinine, Ser: 1.47 mg/dL (ref 0.40–1.50)

## 2010-04-08 LAB — CBC WITH DIFFERENTIAL/PLATELET
BASO%: 0.6 % (ref 0.0–2.0)
EOS%: 1.6 % (ref 0.0–7.0)
HCT: 41.3 % (ref 38.4–49.9)
LYMPH%: 29.9 % (ref 14.0–49.0)
MCH: 29.8 pg (ref 27.2–33.4)
MCHC: 33.2 g/dL (ref 32.0–36.0)
MCV: 90 fL (ref 79.3–98.0)
MONO%: 12 % (ref 0.0–14.0)
NEUT%: 55.9 % (ref 39.0–75.0)
Platelets: 141 10*3/uL (ref 140–400)
lymph#: 1.5 10*3/uL (ref 0.9–3.3)

## 2010-05-30 ENCOUNTER — Ambulatory Visit: Payer: Self-pay | Admitting: Oncology

## 2010-06-03 LAB — CBC WITH DIFFERENTIAL/PLATELET
BASO%: 0.4 % (ref 0.0–2.0)
EOS%: 1.5 % (ref 0.0–7.0)
HCT: 41.8 % (ref 38.4–49.9)
MCH: 31.2 pg (ref 27.2–33.4)
MCHC: 33.3 g/dL (ref 32.0–36.0)
NEUT%: 69 % (ref 39.0–75.0)
RDW: 14.2 % (ref 11.0–14.6)
lymph#: 1.4 10*3/uL (ref 0.9–3.3)

## 2010-06-05 LAB — SPEP & IFE WITH QIG
Beta 2: 4.3 % (ref 3.2–6.5)
Gamma Globulin: 12.9 % (ref 11.1–18.8)
IgA: 210 mg/dL (ref 68–378)
IgG (Immunoglobin G), Serum: 782 mg/dL (ref 694–1618)
IgM, Serum: 65 mg/dL (ref 60–263)

## 2010-06-05 LAB — COMPREHENSIVE METABOLIC PANEL
ALT: 16 U/L (ref 0–53)
AST: 18 U/L (ref 0–37)
Chloride: 108 mEq/L (ref 96–112)
Creatinine, Ser: 1.5 mg/dL (ref 0.40–1.50)
Sodium: 142 mEq/L (ref 135–145)
Total Bilirubin: 0.3 mg/dL (ref 0.3–1.2)

## 2010-06-05 LAB — KAPPA/LAMBDA LIGHT CHAINS: Kappa:Lambda Ratio: 0.82 (ref 0.26–1.65)

## 2010-06-05 LAB — BETA 2 MICROGLOBULIN, SERUM: Beta-2 Microglobulin: 2.14 mg/L — ABNORMAL HIGH (ref 1.01–1.73)

## 2010-08-15 ENCOUNTER — Ambulatory Visit: Payer: Self-pay | Admitting: Oncology

## 2010-08-19 LAB — COMPREHENSIVE METABOLIC PANEL
ALT: 14 U/L (ref 0–53)
Albumin: 4.3 g/dL (ref 3.5–5.2)
CO2: 23 mEq/L (ref 19–32)
Chloride: 108 mEq/L (ref 96–112)
Glucose, Bld: 124 mg/dL — ABNORMAL HIGH (ref 70–99)
Potassium: 4.5 mEq/L (ref 3.5–5.3)
Sodium: 141 mEq/L (ref 135–145)
Total Bilirubin: 0.4 mg/dL (ref 0.3–1.2)
Total Protein: 6.8 g/dL (ref 6.0–8.3)

## 2010-08-19 LAB — CBC WITH DIFFERENTIAL/PLATELET
BASO%: 0.6 % (ref 0.0–2.0)
LYMPH%: 32.8 % (ref 14.0–49.0)
MCHC: 33.2 g/dL (ref 32.0–36.0)
MONO#: 0.7 10*3/uL (ref 0.1–0.9)
MONO%: 14 % (ref 0.0–14.0)
Platelets: 163 10*3/uL (ref 140–400)
RBC: 4.66 10*6/uL (ref 4.20–5.82)
RDW: 14.3 % (ref 11.0–14.6)
WBC: 5.3 10*3/uL (ref 4.0–10.3)
nRBC: 0 % (ref 0–0)

## 2010-09-21 HISTORY — PX: HERNIA REPAIR: SHX51

## 2010-09-26 ENCOUNTER — Ambulatory Visit: Payer: Self-pay | Admitting: Oncology

## 2010-10-07 LAB — COMPREHENSIVE METABOLIC PANEL
ALT: 14 U/L (ref 0–53)
AST: 18 U/L (ref 0–37)
Albumin: 4.7 g/dL (ref 3.5–5.2)
Alkaline Phosphatase: 25 U/L — ABNORMAL LOW (ref 39–117)
BUN: 26 mg/dL — ABNORMAL HIGH (ref 6–23)
Chloride: 107 mEq/L (ref 96–112)
Potassium: 4.5 mEq/L (ref 3.5–5.3)
Sodium: 142 mEq/L (ref 135–145)

## 2010-10-07 LAB — CBC WITH DIFFERENTIAL/PLATELET
BASO%: 0.6 % (ref 0.0–2.0)
Basophils Absolute: 0 10*3/uL (ref 0.0–0.1)
EOS%: 1.8 % (ref 0.0–7.0)
HGB: 14.1 g/dL (ref 13.0–17.1)
MCH: 29.5 pg (ref 27.2–33.4)
MCHC: 33 g/dL (ref 32.0–36.0)
MCV: 89.3 fL (ref 79.3–98.0)
MONO%: 14.3 % — ABNORMAL HIGH (ref 0.0–14.0)
NEUT%: 52.9 % (ref 39.0–75.0)
RDW: 14.3 % (ref 11.0–14.6)

## 2010-10-18 ENCOUNTER — Ambulatory Visit (HOSPITAL_BASED_OUTPATIENT_CLINIC_OR_DEPARTMENT_OTHER): Admission: RE | Admit: 2010-10-18 | Discharge: 2010-10-18 | Payer: Self-pay | Admitting: General Surgery

## 2010-11-21 ENCOUNTER — Ambulatory Visit: Payer: Self-pay | Admitting: Oncology

## 2010-11-25 LAB — CBC WITH DIFFERENTIAL/PLATELET
BASO%: 0.5 % (ref 0.0–2.0)
Eosinophils Absolute: 0.1 10*3/uL (ref 0.0–0.5)
LYMPH%: 16.1 % (ref 14.0–49.0)
MCHC: 34 g/dL (ref 32.0–36.0)
MONO#: 0.8 10*3/uL (ref 0.1–0.9)
NEUT#: 4.8 10*3/uL (ref 1.5–6.5)
Platelets: 163 10*3/uL (ref 140–400)
RBC: 4.3 10*6/uL (ref 4.20–5.82)
RDW: 15.1 % — ABNORMAL HIGH (ref 11.0–14.6)
WBC: 6.8 10*3/uL (ref 4.0–10.3)
lymph#: 1.1 10*3/uL (ref 0.9–3.3)

## 2010-11-27 LAB — SPEP & IFE WITH QIG
Alpha-1-Globulin: 4.4 % (ref 2.9–4.9)
Alpha-2-Globulin: 9.9 % (ref 7.1–11.8)
Gamma Globulin: 11.4 % (ref 11.1–18.8)
IgM, Serum: 70 mg/dL (ref 60–263)
Total Protein, Serum Electrophoresis: 6.8 g/dL (ref 6.0–8.3)

## 2010-11-27 LAB — COMPREHENSIVE METABOLIC PANEL
ALT: 13 U/L (ref 0–53)
Albumin: 4.3 g/dL (ref 3.5–5.2)
CO2: 28 mEq/L (ref 19–32)
Calcium: 9.3 mg/dL (ref 8.4–10.5)
Chloride: 107 mEq/L (ref 96–112)
Glucose, Bld: 111 mg/dL — ABNORMAL HIGH (ref 70–99)
Potassium: 4.4 mEq/L (ref 3.5–5.3)
Sodium: 144 mEq/L (ref 135–145)
Total Protein: 6.8 g/dL (ref 6.0–8.3)

## 2010-11-27 LAB — KAPPA/LAMBDA LIGHT CHAINS: Kappa:Lambda Ratio: 0.89 (ref 0.26–1.65)

## 2010-11-27 LAB — LACTATE DEHYDROGENASE: LDH: 125 U/L (ref 94–250)

## 2010-11-27 LAB — BETA 2 MICROGLOBULIN, SERUM: Beta-2 Microglobulin: 2.34 mg/L — ABNORMAL HIGH (ref 1.01–1.73)

## 2011-01-12 ENCOUNTER — Encounter: Payer: Self-pay | Admitting: Pediatrics

## 2011-01-30 ENCOUNTER — Other Ambulatory Visit: Payer: Self-pay | Admitting: Physician Assistant

## 2011-01-30 ENCOUNTER — Encounter (HOSPITAL_BASED_OUTPATIENT_CLINIC_OR_DEPARTMENT_OTHER): Payer: BC Managed Care – PPO | Admitting: Oncology

## 2011-01-30 ENCOUNTER — Other Ambulatory Visit: Payer: Self-pay | Admitting: Oncology

## 2011-01-30 DIAGNOSIS — C9000 Multiple myeloma not having achieved remission: Secondary | ICD-10-CM

## 2011-01-30 DIAGNOSIS — Z9484 Stem cells transplant status: Secondary | ICD-10-CM

## 2011-01-30 DIAGNOSIS — J069 Acute upper respiratory infection, unspecified: Secondary | ICD-10-CM

## 2011-01-30 LAB — CBC WITH DIFFERENTIAL/PLATELET
BASO%: 0.7 % (ref 0.0–2.0)
LYMPH%: 29.5 % (ref 14.0–49.0)
MCHC: 33.9 g/dL (ref 32.0–36.0)
MCV: 89.3 fL (ref 79.3–98.0)
MONO%: 18.1 % — ABNORMAL HIGH (ref 0.0–14.0)
Platelets: 203 10*3/uL (ref 140–400)
RBC: 4.67 10*6/uL (ref 4.20–5.82)
WBC: 5.2 10*3/uL (ref 4.0–10.3)

## 2011-02-03 ENCOUNTER — Ambulatory Visit (HOSPITAL_COMMUNITY)
Admission: RE | Admit: 2011-02-03 | Discharge: 2011-02-03 | Disposition: A | Discharge status: Home / Self Care | Payer: BC Managed Care – PPO | Source: Ambulatory Visit | Attending: Oncology | Admitting: Oncology

## 2011-02-03 ENCOUNTER — Encounter (HOSPITAL_COMMUNITY): Payer: Self-pay

## 2011-02-03 ENCOUNTER — Other Ambulatory Visit: Payer: Self-pay | Admitting: Oncology

## 2011-02-03 ENCOUNTER — Encounter (HOSPITAL_BASED_OUTPATIENT_CLINIC_OR_DEPARTMENT_OTHER): Payer: BC Managed Care – PPO | Admitting: Oncology

## 2011-02-03 DIAGNOSIS — C9000 Multiple myeloma not having achieved remission: Secondary | ICD-10-CM

## 2011-02-03 DIAGNOSIS — M7989 Other specified soft tissue disorders: Secondary | ICD-10-CM | POA: Insufficient documentation

## 2011-02-03 HISTORY — DX: Malignant (primary) neoplasm, unspecified: C80.1

## 2011-02-03 HISTORY — DX: Solitary plasmacytoma not having achieved remission: C90.30

## 2011-02-03 LAB — COMPREHENSIVE METABOLIC PANEL
Albumin: 4.7 g/dL (ref 3.5–5.2)
BUN: 23 mg/dL (ref 6–23)
CO2: 24 mEq/L (ref 19–32)
Calcium: 10 mg/dL (ref 8.4–10.5)
Chloride: 102 mEq/L (ref 96–112)
Glucose, Bld: 103 mg/dL — ABNORMAL HIGH (ref 70–99)
Potassium: 4.5 mEq/L (ref 3.5–5.3)
Sodium: 137 mEq/L (ref 135–145)
Total Protein: 7.6 g/dL (ref 6.0–8.3)

## 2011-02-03 LAB — IMMUNOFIXATION ELECTROPHORESIS
IgA: 350 mg/dL (ref 68–378)
IgG (Immunoglobin G), Serum: 967 mg/dL (ref 694–1618)
Total Protein, Serum Electrophoresis: 7.6 g/dL (ref 6.0–8.3)

## 2011-02-03 LAB — KAPPA/LAMBDA LIGHT CHAINS: Kappa:Lambda Ratio: 0.85 (ref 0.26–1.65)

## 2011-02-03 LAB — BETA 2 MICROGLOBULIN, SERUM: Beta-2 Microglobulin: 4.16 mg/L — ABNORMAL HIGH (ref 1.01–1.73)

## 2011-03-05 LAB — BASIC METABOLIC PANEL
CO2: 28 mEq/L (ref 19–32)
Calcium: 10 mg/dL (ref 8.4–10.5)
Chloride: 104 mEq/L (ref 96–112)
Creatinine, Ser: 1.55 mg/dL — ABNORMAL HIGH (ref 0.4–1.5)
GFR calc Af Amer: 56 mL/min — ABNORMAL LOW (ref >60)
Sodium: 140 mEq/L (ref 135–145)

## 2011-03-05 LAB — DIFFERENTIAL
Lymphocytes Relative: 29 % (ref 12–46)
Lymphs Abs: 1.3 10*3/uL (ref 0.7–4.0)
Monocytes Relative: 15 % — ABNORMAL HIGH (ref 3–12)
Neutrophils Relative %: 53 % (ref 43–77)

## 2011-03-05 LAB — CBC
Hemoglobin: 14.7 g/dL (ref 13.0–17.0)
Platelets: 156 10*3/uL (ref 150–400)
RBC: 4.94 MIL/uL (ref 4.22–5.81)
WBC: 4.6 10*3/uL (ref 4.0–10.5)

## 2011-03-25 ENCOUNTER — Other Ambulatory Visit: Payer: Self-pay | Admitting: *Deleted

## 2011-03-25 DIAGNOSIS — E785 Hyperlipidemia, unspecified: Secondary | ICD-10-CM

## 2011-03-25 MED ORDER — CHOLINE FENOFIBRATE 135 MG PO CPDR: 135.0000 mg | DELAYED_RELEASE_CAPSULE | Freq: Every day | ORAL | Status: DC

## 2011-03-25 NOTE — Telephone Encounter (Signed)
escribe medication per fax request  

## 2011-04-21 ENCOUNTER — Encounter (HOSPITAL_BASED_OUTPATIENT_CLINIC_OR_DEPARTMENT_OTHER): Payer: BC Managed Care – PPO | Admitting: Oncology

## 2011-04-21 DIAGNOSIS — C9000 Multiple myeloma not having achieved remission: Secondary | ICD-10-CM

## 2011-04-21 DIAGNOSIS — Z9484 Stem cells transplant status: Secondary | ICD-10-CM

## 2011-04-25 ENCOUNTER — Encounter (HOSPITAL_BASED_OUTPATIENT_CLINIC_OR_DEPARTMENT_OTHER): Payer: BC Managed Care – PPO | Admitting: Oncology

## 2011-04-25 ENCOUNTER — Other Ambulatory Visit: Payer: Self-pay | Admitting: Oncology

## 2011-04-25 DIAGNOSIS — Z9484 Stem cells transplant status: Secondary | ICD-10-CM

## 2011-04-25 DIAGNOSIS — C9000 Multiple myeloma not having achieved remission: Secondary | ICD-10-CM

## 2011-04-25 DIAGNOSIS — J069 Acute upper respiratory infection, unspecified: Secondary | ICD-10-CM

## 2011-04-25 LAB — CBC WITH DIFFERENTIAL/PLATELET
Basophils Absolute: 0 10*3/uL (ref 0.0–0.1)
Eosinophils Absolute: 0.1 10*3/uL (ref 0.0–0.5)
HCT: 42.4 % (ref 38.4–49.9)
HGB: 14.4 g/dL (ref 13.0–17.1)
LYMPH%: 34.1 % (ref 14.0–49.0)
MCV: 88.7 fL (ref 79.3–98.0)
MONO#: 0.6 10*3/uL (ref 0.1–0.9)
NEUT#: 2.2 10*3/uL (ref 1.5–6.5)
NEUT%: 49.5 % (ref 39.0–75.0)
Platelets: 162 10*3/uL (ref 140–400)
WBC: 4.4 10*3/uL (ref 4.0–10.3)

## 2011-04-29 ENCOUNTER — Encounter (HOSPITAL_BASED_OUTPATIENT_CLINIC_OR_DEPARTMENT_OTHER): Payer: BC Managed Care – PPO | Admitting: Oncology

## 2011-04-29 DIAGNOSIS — C9000 Multiple myeloma not having achieved remission: Secondary | ICD-10-CM

## 2011-04-29 DIAGNOSIS — Z9484 Stem cells transplant status: Secondary | ICD-10-CM

## 2011-04-29 LAB — IMMUNOFIXATION ELECTROPHORESIS
IgG (Immunoglobin G), Serum: 1530 mg/dL (ref 694–1618)
IgM, Serum: 84 mg/dL (ref 60–263)
Total Protein, Serum Electrophoresis: 8.1 g/dL (ref 6.0–8.3)

## 2011-04-29 LAB — KAPPA/LAMBDA LIGHT CHAINS
Kappa free light chain: 1.18 mg/dL (ref 0.33–1.94)
Kappa:Lambda Ratio: 0.56 (ref 0.26–1.65)
Lambda Free Lght Chn: 2.1 mg/dL (ref 0.57–2.63)

## 2011-04-29 LAB — COMPREHENSIVE METABOLIC PANEL
BUN: 21 mg/dL (ref 6–23)
CO2: 25 mEq/L (ref 19–32)
Creatinine, Ser: 1.55 mg/dL — ABNORMAL HIGH (ref 0.40–1.50)
Glucose, Bld: 113 mg/dL — ABNORMAL HIGH (ref 70–99)
Sodium: 140 mEq/L (ref 135–145)
Total Bilirubin: 0.4 mg/dL (ref 0.3–1.2)
Total Protein: 8.1 g/dL (ref 6.0–8.3)

## 2011-04-29 LAB — LACTATE DEHYDROGENASE: LDH: 131 U/L (ref 94–250)

## 2011-05-06 NOTE — Discharge Summary (Signed)
[name], Giles              ACCOUNT NO.:  [id number]   MEDICAL RECORD NO.:  [id number]          PATIENT TYPE:  INP   LOCATION:  1310                         FACILITY:  [facility]   PHYSICIAN:  Pierce Crane, MD        DATE OF BIRTH:  13-May-1956   DATE OF ADMISSION:  12/12/2008  DATE OF DISCHARGE:                               DISCHARGE SUMMARY   DISCHARGE DIAGNOSES:  1. Pneumonia.  2. History of multiple myeloma status post stem cell transplant, March      2007.   This is a 55 year old gentleman with a known history of recurrent  plasmacytoma and is status post stem cell transplant, who presented with  fever and a previous history of pneumonia.  He had been treated on  December 20 with a course of Levaquin, was started on Levaquin and  continued to have fevers of 102 degrees, Fahrenheit.  He continued to  have coughing which was largely nonproductive.  He was, because of the  high fevers, admitted to the hospital for further management.   Initial evaluation showed a pleasant gentleman, looking stated age.  He  was A and O with stable blood pressure, 95/63, temperature was 99.2,  respiratory rate 20, pulse 72, weight was 185.6.  No adenopathy in the  head and neck area.  Right lung was clear.  Left lung showed some rales.  Heart sounds normal.  No palpable hepatosplenomegaly, no peripheral  edema.   COURSE IN THE HOSPITAL:  Patient was admitted to the hospital.  He was  started on Avelox 400 mg IV daily.  He continued to have fevers to 102  degrees in the hospital.  Infectious disease was consulted who  recommended he be maintained on Avelox.  He was temporarily placed on  Zithromax, as well, and that was discontinued.  His labs initially  showed elevated creatinine of 2.44.  He was given IV fluids and  ultimately his creatinine improved to 1.47 on date of discharge.  He  initially was a little hypoxic and was given albuterol nebulizers which  seemed to improve his oxygenation.   While in the hospital, some of his  medications were adjusted.  Azor, which he was being kept on for blood  pressure control, was discontinued because of the concerns of  interaction with ACE inhibitors and renal function.  He was continued on  Atenolol.  On December 25, his O2 saturation after exercise was above 90  or better.  His potassium was 4.  __________  was 1.47.  Blood pressure  105/67, temperature is 98.2, his saturation with 2 L of oxygen was 94%  and as noted, his oxygenation was above 90% on room air with exercise.  He was felt to be ready for discharge.  He was discharged with the  following medications.   1. Atenolol 100 mg a day.  2. Trilipix 135 mg daily.  3. Loratadine 10 mg at h.s.  4. Nexium 40 mg daily.  5. Vytorin 10/80.  6. Nasonex 2 sprays b.i.d.  7. Albuterol inhaler 2 puffs t.i.d.  8. Levaquin 500 mg a  day for another 10 days.   He was asked to follow up in the outpatient clinic in the following week  to recheck his labs and chest x-ray.  Patient was discharged from the  hospital in stable condition.  Prognosis is good.      Pierce Crane, MD  Electronically Signed     PR/MEDQ  D:  12/15/2008  T:  12/15/2008  Job:  [id number]   cc:   Larina Earthly, M.D.  Fax: [phone]   Peter M. [place], M.D.  Fax: [phone]

## 2011-05-06 NOTE — H&P (Signed)
[name], [name]              ACCOUNT NO.:  [id number]   MEDICAL RECORD NO.:  [id number]          PATIENT TYPE:  INP   LOCATION:  1507                         FAC[facility]:  [facility]   PHYSICIAN:  Pierce Crane, MD        DATE OF BIRTH:  10/12/1956   DATE OF ADMISSION:  12/12/2008  DATE OF DISCHARGE:                              HISTORY & PHYSICAL   SUBJECTIVE HPI:  [name] is a delightful but unfortunate 55-year-  old [place] gentleman with a prior history of multiple myeloma, for  which he underwent stem cell transplant in March of 2007.  He is being  admitted today to third floor oncology due to a persistent febrile  illness.  He actually presented to the [facility] Emergency Department  on December 10, 2008 with a fever of 104.4.  He had had prior left lower  lobe pneumonia, for which he had been treated with Levaquin.  A chest x-  ray was obtained and showed that the prior left lower lobe pneumonia had  cleared.  Essentially the right lung was clear but he was resumed on  Levaquin.  He has been utilizing Advil alternating with Tylenol.  At the  office today, is actually his lowest temp at 99.2.  He was 102.3 this  morning.  He has had a dry nonproductive cough but he feels that his  breathing is shallow.  His 02 sat on room air is between 92 and 93%.  He denies any headache associated with coughing.  He does have nasal  discharge from primarily out of the left nostril as compared to the  right.  No sore throat.  He has had low-grade, if any, nausea.  No frank  diarrhea symptoms.  He has not had a bowel movement today.  His appetite  is off.  He notes his sense of taste is off.  He denies any frank  diffuse bone pain, myalgias, or arthralgias.  He has not been exposed to  anyone that he is aware of with similar symptomatology.  He has not  traveled very extensively recently due to illness.  Upon arriving in our  office, we obtained a CBC which shows hemoglobin at 13.4 grams,  platelet  count 136,000, WBC at 4400 with an ANC of 2800.  At the time of this  dictation, a CMET, LDH, quantitative immunoglobulin, blood culture is  pending along with a sputum for culture and urinalysis and repeat chest  x-ray.   PAST MEDICAL HISTORY:  As per HPI to include a history of hypertension,  prior CABG, right hip replacement secondary to plasmacytoma in the right  hip, history of hydronephrosis of the right kidney requiring stent  placement secondary to plasmacytoma.   MEDICATIONS AT THE TIME OF ADMISSION:  Atenolol 100 mg 1 p.o. daily,  Trilipix 135 mg 1 p.o. q.h.s., loratadine 10 mg 1 p.o. q.h.s., Azor  10/40 one p.o. daily, Nexium 40 mg 1 p.o. daily, Vytorin 10/80 one p.o.  daily, Nasonex 2 sprays each nostril 1 time a day, and Symbicort 80/4.5  two puffs p.o. b.i.d., Tylenol/Advil p.r.n.  FAMILY MEDICAL HISTORY:  Noncontributory.   SOCIAL HISTORY:  Noncontributory.   REVIEW OF SYSTEMS:  As per HPI.   OBJECTIVE PHYSICAL EXAM:  VITAL SIGNS:  Blood pressure is 95/63, pulse  72, respirations 20, temp 99.2.  Weight 185.6 pounds.  This is an  approximate 6-pound weight loss over the past week.  HEENT:  Conjunctivae pink.  Sclerae anicteric.  Oropharynx shows some  evidence of oral candidiasis-like changes on the tongue.  The buccal  mucosa is spared.  LUNGS:  The right lung fields are actually clear.  The left though, it  is difficult to really hear lung sounds on inspiration though on  expiration, they are present.  No wheezing.  HEART:  A bradycardic rate but normal rhythm without murmurs, rubs,  gallops or clicks.  ABDOMEN:  Soft, nontender.  Normal bowel sounds are present.  EXTREMITIES:  Benign.  NEUROLOGIC:  Exam was nonfocal.  The patient is alert and oriented x4.   LABORATORY DATA:  Hemoglobin 13.4 grams, platelet count 136,000, WBC  4400 with an ANC of 2800.  CMET, LDH, and quantitative immunoglobulin,  and blood cultures are pending.  Urine for urinalysis  and culture and  sputum for culture and Gram stain are also pending.   RADIOGRAPHIC DATA:  Chest x-ray pending.   IMPRESSION/RECOMMENDATIONS/PLAN:  Persistent febrile illness in a  patient with prior multiple myeloma treated via status post  stem cell  transplant in March of 2007.  The patient's case has been reviewed with  Dr. Pierce Crane, who agreed to hospital admit for more aggressive  intravenous antibiotic coverage and a fuller assessment including  quantitative immunoglobulin, possible intravenous immune globulin  infusion if necessary.  Dr. Donnie Coffin has contacted Dr. Ninetta Lights and  infectious disease for their input.  Will discontinue oral Levaquin at  this time and initiate Avelox 400 mg intravenously daily unless changed  by infectious disease.  Otherwise, we will continue his current home  medications.  He will be on respiratory isolation.  Followup pending  hospital discharge.      Amada Kingfisher, P.A.      Pierce Crane, MD  Electronically Signed    CTS/MEDQ  D:  12/12/2008  T:  12/12/2008  Job:  [id number]

## 2011-05-09 NOTE — Op Note (Signed)
[name], [name]              ACCOUNT NO.:  [id number]   MEDICAL RECORD NO.:  [id number]          PATIENT TYPE:  OUT   LOCATION:  XRAY                         FACILITY:  [facility]   PHYSICIAN:  Adolph Pollack, M.D.DATE OF BIRTH:  1956-09-19   DATE OF PROCEDURE:  11/26/2004  DATE OF DISCHARGE:  11/23/2004                                 OPERATIVE REPORT   PREOPERATIVE DIAGNOSIS:  Soft tissue mass left lateral knee in a patient  with a history of plasmacytoma.   POSTOPERATIVE DIAGNOSIS:  Soft tissue mass left lateral knee in a patient  with a history of plasmacytoma.   OPERATION PERFORMED:  Excision of 2 cm soft tissue mass, lateral left knee.   SURGEON:  Adolph Pollack, M.D.   ANESTHESIA:  1% plain lidocaine.   INDICATIONS FOR PROCEDURE:  [name] is a 55 year old male with a  history of plasmacytoma.  A soft tissue mass has now presented in the  lateral knee area.  It has been growing.  There is concern that it might be  recurrence of plasmacytoma and I have been asked to remove it by Dr. Donnie Coffin.  The procedure and the risks were explained to the patient.   DESCRIPTION OF PROCEDURE:  He was placed supine on the minor procedure  table.  The hair was clipped.  The area was then sterilely prepped and  draped.  Local anesthetic was infiltrated directly over the mass. An  elliptical incision was made directly over the mass.  Subcutaneous tissue  dissected free from the mass and it was excised sharply.  It appeared to be  lipomatous tissue that was very firm.  There were few extra nodules in the  area that I excised as well.  The remaining tissue was smooth.   Bleeding was controlled with electrocautery.  Once hemostasis was adequate,  I closed the wound with a running locking 3-0 Monocryl suture.  Neosporin  and a sterile dressing were applied.  The patient tolerated the procedure  well without any apparent complications.  He subsequently was sent home from  the minor  procedure room in satisfactory condition.  He will call me for any  problems and also call to make an appointment to see me in 10 to 14 days for  suture removal.      Tish Men  D:  11/26/2004  T:  11/26/2004  Job:  [id number]   cc:   Pierce Crane, M.D.  [address] [facility]  [place]  [place] [address]  Fax: [phone]

## 2011-05-09 NOTE — Procedures (Signed)
[facility]  Patient:    [name], [name]                       MRN: [id number] Proc. Date: 06/12/00 Adm. Date:  [id number] Disc. Date: [id number] Attending:  Rich Brave CC:         Lennon Alstrom. Felipa Eth, M.D.                           Procedure Report  PROCEDURE:  Colonoscopy.  INDICATION FOR PROCEDURE:  This is a 55 year old with family history of colon cancer in his mother when she was in her late 75s.  Colonoscopy in another locality about 5 years ago that was reportedly negative except for diverticulosis.  FINDINGS:  Sigmoid diverticulosis, otherwise normal exam.  PROCEDURE IN DETAIL:  The patient was familiar with the procedure from prior examination.  He provided written consent.  SEDATION:  Fentanyl 100 mcg and Versed 10 mg IV without arrhythmia and _____ saturation.  The ellipsoid style video colonoscope was easily advanced to the cecum and pull back was then performed.  Some external abdominal compression helped reduce looping and the sigmoid region was a little tortuous.  Digital exam at the start of the procedure was unremarkable.  There was mild to moderate sigmoid and left-sided diverticulosis, but no polyps, cancer, colitis or vascular malformations were seen.  Retroflexion in the rectum was unremarkable.  The patient tolerated the procedure well.  There were no apparent complications.  IMPRESSION:  Normal exam except for diverticulosis.  PLAN:  Followup colonoscopy in 5 years in view of the family history of colon cancer. DD:  06/12/00 TD:  06/15/00 Job: [id number]

## 2011-05-09 NOTE — Op Note (Signed)
NAMEJMICHAEL, [name] NO.:  [id number]   MEDICAL RECORD NO.:  [id number]          PATIENT TYPE:  OUT   LOCATION:  OMED                         FACILITY:  [facility]   PHYSICIAN:  Pierce Crane, M.D.      DATE OF BIRTH:  [birth year]   DATE OF PROCEDURE:  02/06/2006  DATE OF DISCHARGE:                                 OPERATIVE REPORT   PROCEDURE:  Bone marrow aspirate and biopsy.   Mr. Shorey is a 55 year old gentleman with a known history of recurrent  __________ cytoma. The patient was here today for restaging __________ bone  marrow biopsy pre stem cell transplant.   DESCRIPTION OF PROCEDURE:  The patient was placed in the prone position and  he was given 7.5 mg of Versed, 25 mg of IV Demerol. The left posterior  superior iliac spine was cleansed with Hibitane and anesthetized with 3%  Xylocaine. A bone marrow aspirate and biopsy was obtained. The patient  tolerated the procedure well. The patient was discharged from Day Surgery in  stable condition.   PROGNOSIS:  Good.      Pierce Crane, M.D.  Electronically Signed     PR/MEDQ  D:  02/06/2006  T:  02/06/2006  Job:  [id number]

## 2011-05-09 NOTE — Op Note (Signed)
NAMERASHOD, GOUGEON              ACCOUNT NO.:  [id number]   MEDICAL RECORD NO.:  [id number]          PATIENT TYPE:  INP   LOCATION:  X006                         [facility]ACILITY:  [facility]   PHYSICIAN:  Ollen Gross, M.D.    DATE OF BIRTH:  [birth year]   DATE OF PROCEDURE:  02/03/2005  DATE OF DISCHARGE:                                 OPERATIVE REPORT   PREOPERATIVE DIAGNOSIS:  Right acetabular tumor.   POSTOPERATIVE DIAGNOSIS:  Right acetabular tumor.   PROCEDURE:  Right total hip arthroplasty with excision tumor and autograft  bone grafting.   SURGEON:  Ollen Gross, M.D.   ASSISTANT:  Alexzandrew L. Julien Girt, P.A.   ANESTHESIA:  General.   ESTIMATED BLOOD LOSS:  300.   DRAIN:  Hemovac x2.   COMPLICATIONS:  None.   CONDITION:  Stable to recovery.   BRIEF CLINICAL NOTE:  Mr. Ehle is a 55 year old male who has a history  of a solitary myeloma in his right acetabular area.  It has gotten  progressively larger over the past year.  He has had intractable pain  refractory even to OxyContin.  The lesion is in an area that is not amenable  to primary excision without doing a hip replacement, secondary to the fact  that it is subchondral and probably has eroded into the joint.  He presents  now for total hip arthroplasty with excision of the lesion and autograft  bone grafting from his femoral head.   PROCEDURE IN DETAIL:  After the successful administration of general  anesthetic, the patient was placed in the left lateral decubitus position  with the right side up and held with the hip positioner.  The right upper  extremity was isolated from his perineum with plastic drapes and prepped and  draped in the usual sterile fashion.  A standard posterolateral incision was  made with a 10 blade through the subcutaneous tissue to the level of the  fascia lata which was incised in line with the skin incision.  The sciatic  nerve was palpated and protected, and the short rotators  isolated off the  femur.  Capsulectomy was performed, and the hip was dislocated.  I did not  see any intra-articular tumor.  The center of the femoral head was marked,  and a trial prosthesis placed, such that the center of the trial head  corresponds to the center of the patient's native femoral head.  Osteotomy  line was marked on the femoral neck and osteotomy made with an oscillating  saw.  The femoral head was then removed and the femur retracted anteriorly  to gain acetabular exposure.  We removed the soft tissue around the rim of  the acetabulum and began reaming at 43 mm.  As soon as I reamed through the  cartilage, we were able to see the tumor up in the anterior-inferior  quadrant.  I reamed all the way up to 53 mm so that we could place a 54-mm  acetabular shell.  I curetted the tumor out.  It went into the pubis and  went medially to the medial wall.  Once  I excised all the tumor, we sent it  for pathologic specimen.  I then took a 43 reamer and reamed out his femoral  head to gain cancellous bone graft.  We then packed the lesion with the  cancellous graft and packed it in tightly to fill the defect completely.  The 54-mm Pinnacle acetabular shell was then impacted in anatomic position  and transfixed with two dome screws.  The trial 32-mm neural +4 liner was  placed.   The femur was prepared first with the canal finder and irrigation.  Actual  reaming was performed to 13.5 mm, proximal reaming to an 18-B, and the  sleeve machine to a large.  An 18-B and large trial was then placed, and 18  x 13 stem and a 36+8 neck.  We matched his knee to anteversion.  A 32+0 head  was placed, and hip was reduced with great stability, full extension, full  external rotation to 70 degrees of flexion, 40 degrees of adduction and 90  degrees of internal rotation, and 90 degrees of flexion, 70 degrees internal  rotation.  By placing the right leg on top of the left, the leg lengths were  found  to be equal.  The hip was then dislocated and trials removed.   The permanent Apex hole eliminator was placed into the acetabular shell.  The permanent 54-mm neutral +4 Marathon liner was placed.  It is a metal and  polyethylene hip replacement.  The 18-B large sleeve was placed, and an 18 x  13 stem and a 36+8 neck matching the native anteversion.  A 32+0 head was  placed.  The hip was reduced with the same stability parameters.  The stem  had an exceeding tight fit, thus I wanted to ensure that we did not crack  the bone at or below the calcar, thus I elevated up the vastus lateralis to  explore the subcalcar region.  I did not see any cracks in that area.  I  examined the calcar itself, and there were no cracks.  The hip was then  thoroughly irrigated, and the short rotators were re-attached to the femur  through drill holes.  The fascia lata was closed over a Hemovac drain with  interrupted #1 Vicryl, the subcutaneous tissue closed with #1 and 2-0  Vicryl, and subcuticular running 4-0 Monocryl.  Incision was cleaned and  dry, and Steri-Strips applied.  Subcutaneous tissues were then infiltrated  with 20 cc of 0.5% Marcaine.  Bulky sterile dressing was applied.  Drain was  hooked to suction.  He was placed into a knee immobilizer, awakened, and  transported to recovery in stable condition.      FA/MEDQ  D:  02/03/2005  T:  02/03/2005  Job:  [id number]

## 2011-05-09 NOTE — Op Note (Signed)
[name], [name] NO.:  [id number]   MEDICAL RECORD NO.:  [id number]          PATIENT TYPE:  OUT   LOCATION:  OMED                         FACILITY:  [facility]   PHYSICIAN:  Pierce Crane, M.D.      DATE OF BIRTH:  Nov 28, 1956   DATE OF PROCEDURE:  10/29/2004  DATE OF DISCHARGE:                                 OPERATIVE REPORT   PROCEDURE:  Bone marrow biopsy.   [name] is a 55 year old gentleman with a known history of  plasmocytoma.  He was brought in for bone marrow biopsy.  He was placed in a  prone position.  He was given a total of 7.5 mg of Versed and 37.5 mg of  Demerol IV.  His left posterior iliac crest was identified and cleansed with  Hibiclens.  He was given 2% of Xylocaine for local anesthesia.  A bone  marrow aspirate was obtained for routine evaluation for plasmocytoma  cytogenetics.  Bone marrow biopsy was also obtained.  The patient tolerated  the procedure well and will be discharged from the clinic in stable  condition.  He will follow up in the outpatient clinic for description of  result.     [name]   PR/MEDQ  D:  10/29/2004  T:  10/29/2004  Job:  [id number]

## 2011-05-09 NOTE — H&P (Signed)
NAMEJEFFREY, GRAEFE NO.:  [id number]   MEDICAL RECORD NO.:  [id number]           PATIENT TYPE:   LOCATION:                                 FACILITY:   PHYSICIAN:  Peter M. Swaziland, M.D.  DATE OF BIRTH:  03/05/1956   DATE OF ADMISSION:  DATE OF DISCHARGE:                                HISTORY & PHYSICAL   HISTORY OF PRESENT ILLNESS:  [name] is a 55 year old white male with history  of coronary artery disease status post CABG in 1995.  He had had history of  coronary disease for at least 10 years preceding his bypass surgery, but had  developed class IV symptoms and so underwent bypass surgery in [place].  He had a follow-up stress Cardiolite here in February of 2006 which  showed some mild distal anterior and apical ischemia.  His ejection fraction  was 52%.  He has had no symptoms of angina or shortness of breath on medical  therapy.  This past year he was also diagnosed with multiple myeloma with  recurrent plasmacytomas as well as associated with some renal insufficiency.  He is now being considered for a bone marrow transplant in [place].  As  part of his transplant work-up it is recommended that he have cardiac  catheterization performed.  Due to his multiple myeloma and prior history of  renal insufficiency it is recommended that he be admitted for overnight  hydration and Mucomyst therapy.  His baseline creatinine here was 2.3 but  the patient reports that his kidney function had returned to normal.   PAST MEDICAL HISTORY:  1.  Coronary artery disease status post CABG x3 in 1995.  2.  Multiple myeloma with recurrent plasmacytomas.  Patient has completed      five cycles of chemotherapy with Velcade, thalidomide, and Decadron.  3.  Status post ureteral stent for obstruction by Dr. Retta Diones.  4.  Hypertension.  5.  Hypercholesterolemia.  6.  Diverticulosis.  7.  Gout.  8.  GERD.   PAST SURGICAL HISTORY:  Right total hip arthroplasty and  excision of  plasmocytoma in February of 2006, removal of a plasmocytoma from his left  chest in July of 2006.   CURRENT MEDICATIONS:  1.  Prevacid 30 mg per day.  2.  Lotrel 5/10 mg per day.  3.  Allopurinol 300 mg every other day.  4.  Aspirin 325 mg per day.  5.  Atenolol 100 mg per day.  6.  Vytorin 10/40 mg per day.  7.  Oxycodone 80 mg per day.  8.  Thalidomide chemotherapy.   He has no known allergies.   SOCIAL HISTORY:  He is married.  He has one son.  He quit smoking in 1995.  He works as a Production designer, theatre/television/film for [employer].   FAMILY HISTORY:  His father died at age 74 with a myocardial infarction.  Mother died at age 35 of colon cancer.  He has two sisters in good health.   REVIEW OF SYSTEMS:  He does complain of increased muscle weakness since he  has  been on Decadron.  He has to walk with a cane.  He has had no fever or  chills.  His weight has remained fairly stable.  His urine output has been  okay.  His other review of systems are negative.   PHYSICAL EXAMINATION:  GENERAL:  Patient is a bald, well-developed white  male in no apparent distress.  VITAL SIGNS:  Blood pressure 130/90, pulse 60 and regular.  His weight is  173 pounds.  Respirations are normal.  HEENT:  Normocephalic, atraumatic.  Pupils are equal, round, and reactive to  light and accommodation.  His extraocular movements are full.  Oropharynx is  clear.  NECK:  Supple without JVD, adenopathy, thyromegaly, or bruits.  LUNGS:  Clear to auscultation, percussion.  CARDIAC:  Regular rate and rhythm.  Normal S1 and S2 without gallop, murmur,  rub, or click.  ABDOMEN:  Soft and nontender.  He has no hepatosplenomegaly.  EXTREMITIES:  He has a surgical scar on his right hip.  He has no lower  extremity edema or cyanosis.  Pulses are 2+ and symmetric.  He does  demonstrate proximal more than distal muscle weakness.  NEUROLOGIC:  Otherwise nonfocal.   ECG today is normal.   IMPRESSION:  1.  Coronary  artery disease status post CABG in 1995.  Mildly abnormal      Cardiolite study in February 2006.  2.  Multiple myeloma with recurrent plasmocytomas.  3.  Chronic renal insufficiency.  4.  Hypertension.  5.  Hypercholesterolemia.   PLAN:  Will admit and obtain baseline laboratory data.  He will be treated  with IV hydration overnight and given oral Mucomyst and undergo diagnostic  cardiac catheterization the following day.           ______________________________  Peter M. Swaziland, M.D.     PMJ/MEDQ  D:  01/09/2006  T:  01/09/2006  Job:  [id number]   cc:   Bertram Millard. Dahlstedt, M.D.  Fax: [phone]   Pierce Crane, M.D.  Fax: [phone]   Larina Earthly, M.D.  Fax: [phone]   Wilber Bihari. Caryn Section, M.D.  Fax: [phone]   Laney Pastor, M.D.  [facility]

## 2011-05-09 NOTE — Op Note (Signed)
[name], [name]              ACCOUNT NO.:  [id number]   MEDICAL RECORD NO.:  [id number]          PATIENT TYPE:  AMB   LOCATION:  [facility]                         FACILITY:  [facility]   PHYSICIAN:  Bertram Millard. Dahlstedt, M.D.DATE OF BIRTH:  12/30/1955   DATE OF PROCEDURE:  08/11/2005  DATE OF DISCHARGE:                                 OPERATIVE REPORT   PREOPERATIVE DIAGNOSIS:  Multiple myeloma with hydronephrosis/obstruction of  the right kidney.   POSTOPERATIVE DIAGNOSIS:  Multiple myeloma with hydronephrosis/obstruction  of the right kidney.   PROCEDURE:  Cystoscopy, exchange of right double-J stent.   SURGEON:  Bertram Millard. Dahlstedt, M.D.   ANESTHESIA:  General LMA.   COMPLICATIONS:  None.   BRIEF HISTORY:  This nice 55 year old gentleman presented to me several  weeks ago with right hydronephrosis and resultant renal insufficiency. He  was found to have a pelvic tumor, most likely extension from his ilium. He  had had prior surgery for a plasmacytoma in his right hip.   The patient underwent stent placement about two weeks ago. On follow-up, he  was found to have an unchanged creatinine. Evaluation in the office revealed  persistent right hydronephrosis. Cystogram to assess for reflux of contrast  through and along the stent revealed no evidence of contrast up into the  right ureter. This indicated that the patient's stent was not functioning  properly.   In discussing things with him in the office, I have recommended either stent  exchange with a larger caliber stent versus percutaneous nephrostomy. The  patient would like to go with an internal drainage at the present time. He  presents for stent exchange, being aware of the risks and complications.   DESCRIPTION OF PROCEDURE:  Ms. Reuter was administered preoperative IV  antibiotics and taken to the operating room where a general anesthetic was  administered. He was placed in the dorsal lithotomy position. Genitalia  and  perineum were prepped and draped. A 22-French panendoscope was passed into  his bladder. Urethra and bladder appeared normal. The right double-J stent  was identified at the right ureteral orifice. It was extracted just to the  meatus. I then tried to place a guidewire through the internal part of the  stent. I was unable to pass this. During manipulation, the stent was found  to have migrated out of the ureter. At this point it was extracted, and the  cystoscope replaced. I was able to navigate the right ureteral orifice with  an end-hole catheter and a Glidewire. Once adequate positioning was seen,  the end-hole catheter was removed and an 8.2 French x 24 cm sent stent was  placed under fluoroscopic and cystoscopic guidance up into the right kidney.  Good proximal and distal curls were seen. The bladder was drained and then  the procedure terminated.   The patient tolerated the procedure well. He was awakened, extubated, and  taken to PACU in stable condition.   He will follow-up in my office in approximately 1-2 weeks. He was discharged  on a sulfa pill as well as oxybutynin 1-2 every 8 hours as needed for  stent  discomfort.      Bertram Millard. Dahlstedt, M.D.  Electronically Signed     SMD/MEDQ  D:  08/11/2005  T:  08/11/2005  Job:  [id number]   cc:   Wilber Bihari. Caryn Section, M.D.  [address]  [place]  [place] [address]  Fax: [phone]   Larina Earthly, M.D.  [address]  [place]  [place] [address]  Fax: [phone]   Pierce Crane, M.D.  [address] [facility]  [place]  [place] [address]  Fax: [phone]

## 2011-05-09 NOTE — Cardiovascular Report (Signed)
NAMEJOAOVICTOR, [name]              ACCOUNT NO.:  [id number]   MEDICAL RECORD NO.:  [id number]          PATIENT TYPE:  INP   LOCATION:  6531                         FACILITY:  [facility]   PHYSICIAN:  Peter M. Swaziland, M.D.  DATE OF BIRTH:  02-17-1956   DATE OF PROCEDURE:  01/13/2006  DATE OF DISCHARGE:  01/13/2006                              CARDIAC CATHETERIZATION   INDICATIONS FOR PROCEDURE:  The patient is a 55 year old white male who has  known history of coronary artery disease. He is status post coronary bypass  surgery in August of 1994 in [place]. He is now being considered for bone  marrow transplant for multiple myeloma. He has a history of recurrent  plasmacytomas. He has had no recent anginal symptoms. The stress Cardiolite  study one year ago showed mild anterior apical ischemia with normal ejection  fraction of 52%.   PROCEDURES:  Left heart catheterization, coronary angiography, aortic root  angiography, saphenous vein graft angiography x2 and LIMA graft angiography.   ACCESS:  Via the right femoral artery using standard Seldinger technique.   EQUIPMENT USED:  A 6-French 4 cm right and left Judkins catheters, 6-French  pigtail catheter, 6-French left coronary bypass catheter.   MEDICATIONS:  Local anesthesia 1% Xylocaine, Versed 1 milligram IV.   CONTRAST:  140 cc of Omnipaque. Left ventricular angiography was not  performed to conserve on dye load.   HEMODYNAMIC DATA:  Aortic pressures 128/69 with a mean of 93 mmHg. Left  ventricular pressure was 123 with EDP of 13 mmHg.   ANGIOGRAPHIC DATA:  Left coronary arises and distributes normally. The left  main coronary appears to be without significant disease. There is mild  tapering in the distal left main up to 30%.   The left anterior descending artery is occluded at its ostium.   There is a moderate-sized ramus branch. It has 80-90% stenosis at its  takeoff and has diffuse 90% disease throughout the proximal to  mid-vessel.  It does fill antegrade without any competitive flow.   The left circumflex coronary artery is seen to course in the AV groove and  give off a small distal branch. The first obtuse marginal vessel is  occluded. There is approximately 40% disease in the distal left circumflex  coronary artery.   The right coronary arises and distributes normally. It has a high takeoff of  the PDA in the midvessel and in the right coronary is occluded after this.  There is approximately 30% narrowing in the PDA itself.   The posterolateral branches of the right coronary artery fill by left-to-  right collaterals.   The saphenous vein graft to the first obtuse marginal vessel is patent with  good distal runoff. This vein graft is relatively short and very ectatic.  There is significant streaming of flow in the mid to distal vein graft with  what appears to be a moderate stenosis of approximately 70%. This was  somewhat difficult to quantitate due to significant streaming of flow.   The saphenous vein graft to the ramus intermediate branch is occluded at its  origin.  The LIMA graft to the LAD is widely patent with good distal runoff and fills  the first diagonal branch well.   Aortic root angiography demonstrates normal aortic root size. Again, no  graft is seen coming off to fill the ramus branch.   FINAL INTERPRETATION:  1.  Severe three-vessel obstructive atherosclerotic coronary disease.  2.  Patent left internal mammary artery graft to the left anterior      descending artery/diagonal branch.  3.  Patent saphenous vein graft to the first obtuse marginal vessel with a      moderate vein graft stenosis.  4.  Occluded saphenous vein graft to the ramus intermediate branch.  5.  Occluded distal right coronary artery but fills well by left-to-right      collaterals.   PLAN:  The intermediate branch is not suitable for catheter-based  intervention given its long length and severe  disease. Since the patient is  asymptomatic at this time and his LIMA graft is open in the right coronary  is supplied by good collateral flow, I would recommend continued medical  therapy at this time.           ______________________________  Peter M. Swaziland, M.D.     PMJ/MEDQ  D:  01/13/2006  T:  01/13/2006  Job:  [id number]   cc:   Wilber Bihari. Caryn Section, M.D.  Fax: [phone]   Pierce Crane, M.D.  Fax: [phone]

## 2011-05-09 NOTE — Op Note (Signed)
[name], [name]              ACCOUNT NO.:  [id number]   MEDICAL RECORD NO.:  [id number]          PATIENT TYPE:  AMB   LOCATION:  DAY                          FACILITY:  [facility]   PHYSICIAN:  Bertram Millard. Dahlstedt, M.D.DATE OF BIRTH:  11/15/1956   DATE OF PROCEDURE:  07/28/2005  DATE OF DISCHARGE:                                 OPERATIVE REPORT   PREOPERATIVE DIAGNOSIS:  Right hydronephrosis secondary to pelvic mass.   POSTOPERATIVE DIAGNOSIS:  Right hydronephrosis secondary to pelvic mass.   PRINCIPAL PROCEDURE:  Cysto, right retrograde ureteral pyelogram, double-J  stent placement.   SURGEON:  Dr. Retta Diones.   ANESTHESIA:  General.   COMPLICATIONS:  None.   BRIEF HISTORY:  [name] is a 55 year old gentleman whom I saw recently, sent in  by Dr. Caryn Section for evaluation of the management of right hydronephrosis.  He has  a history of multiple myeloma and has had right hip surgery before.  The  patient also has a cardiac history and was to undergo cardiac  catheterization.  His creatinine was found to be 2.3 where in the past it  had been normal.  He was referred to Dr. Caryn Section; ultrasound was performed and  showed right hydronephrosis.   I first saw him in the office on the 28th of last month.  He then underwent  a CT of the abdomen and pelvis without IV contrast.  This showed moderate  right-sided hydronephrosis, with a normal left kidney.  There was a question  of a colon mass, although this was not evaluated with colonoscopy.  There  was some ill-defined low-density bone lesions within T12, L1 and L2  vertebral bodies and the 9th rib on the left.  He was found to have a right  pelvic mass causing the right hydro.   The patient presents at this time for cysto and right double-J stent  placement.  He underwent cystoscopy in the office recently which revealed  the patient to have an apparent right ureteral orifice.  There was some  distortion of the bladder, but I felt like I could  get a stent in.   He is aware of risks and complications of stent placement.  We are doing  this to preserve kidney function.   DESCRIPTION OF PROCEDURE:  The patient was administered preoperative IV  antibiotics and taken to the operating room where general anesthetic was  administered.  He was placed in the dorsal lithotomy position.  Genitalia  and perineum were prepped and draped.  A 22-French panendoscope was placed  in the bladder.  The bladder appeared mostly normal except for some  extrinsic compression on the right trigone and right lateral wall.  The left  ureteral orifice was normal; the right was somewhat displaced medially and  superiorly.  I was eventually able to negotiate a Glidewire into the right  ureteral orifice and up, using fluoroscopic guidance, into the right renal  pelvis.  This was quite difficult, however.  Once the Glidewire was in  place, a 24 cm x 6-French double-J stent was placed using fluoroscopic and  cystoscopic guidance.  A hydronephrotic  drainage was seen after this.  Good  curls were seen proximally and distally.  I did need to pull on the stent a  little bit to leave a good curl within the bladder.  At this point, the  bladder was drained and the procedure terminated.  The patient tolerated the  procedure well, was extubated, taken to PACU in stable condition.   The patient will follow up within about a week.  I discharged him on  oxybutynin 5 mg 1 p.o. q.8h. urinary urgency and frequency (#50 with 3  refills), and Bactrim DS 1 p.o. b.i.d. for 3 days.       SMD/MEDQ  D:  07/28/2005  T:  07/28/2005  Job:  [id number]   cc:   Larina Earthly, M.D.  [address]  [place]  [place] [address]  Fax: [phone]   Wilber Bihari. Caryn Section, M.D.  [address]  [place]  [place] [address]  Fax: [phone]   Peter M. Swaziland, M.D.  [id number]  [place], [place] [address]  Fax: [phone]   Pierce Crane, M.D.  [address] [facility]  [place]  [place] [address]  Fax:  [phone]

## 2011-05-09 NOTE — H&P (Signed)
NAMEPLES, TRUDEL NO.:  [id number]   MEDICAL RECORD NO.:  [id number]          PATIENT TYPE:  INP   LOCATION:  NA                           FACILITY:  [facility]   PHYSICIAN:  Ollen Gross, M.D.    DATE OF BIRTH:  Mar 22, 1956   DATE OF ADMISSION:  02/03/2005  DATE OF DISCHARGE:                                HISTORY & PHYSICAL   ONCOLOGIST:  Pierce Crane, M.D.   RADIATION ONCOLOGIST:  Jeanice Lim, M.D.   CARDIOLOGIST:  Peter M. Swaziland, M.D.   GASTROENTEROLOGIST:  Bernette Redbird, M.D.   DATE OF OFFICE VISIT AND HISTORY AND PHYSICAL:  January 24, 2005.   CHIEF COMPLAINT:  Right hip pain.   HISTORY OF PRESENT ILLNESS:  The patient is a 55 year old male who has been  seen by Dr. Lequita Halt for ongoing right hip pain.  He has a significant  history of having multiple myeloma for the past two years now.  He has been  noted to have a large medial acetabular tumor.  He was sent to [facility]  where they biopsied it and found it was multiple myeloma.  This was the only  site of myeloma until he had a soft tissue occurrence on his lower leg.  He  has undergone maximum radiation of the hip which is quite painful.  He has  been on OxyContin and oxycodone for pain.  Dr. Donnie Coffin recently got an MRI  scan showing the defect is well contained.  He does have a good rim of bone  surrounding the tumor.  His pain is directly correlated with the location of  the tumor in the hip being well circumcised but extremely painful.  It is  felt that he would benefit from undergoing surgical intervention.  He would  like to go ahead and proceed with something surgical at this time.  Although  he does not have significant arthritis, the only thing that would  effectively treat this would be a total hip arthroplasty with excision of  tumor and bone grafting in that area.  The risks and benefits of this  procedure have been discussed with the patient at length and he elects to  proceed  with surgery. It was noted in his preoperative work up that he did  have some EKG changes which were different from prior EKG's noted by Dr.  Swaziland.  He was seen recently and underwent further work up to include  Cardiolite.  He was seen by Dr. Swaziland and felt that he is stable from a  cardiac standpoint to undergo surgery.   ALLERGIES:  No known drug allergies.   CURRENT MEDICATIONS:  1.  OxyContin 40 mg q.12h.  2.  Prevacid 30 mg daily.  3.  Vytorin 10/40 daily.  4.  Ambien 10 mg.  5.  Aspirin stopped prior to surgery.  6.  Phenergan p.r.n.  7.  Zometa monthly.  8.  Atenolol 100 mg daily.  9.  Allopurinol 300 mg daily.  10. Lotrel 10/20 daily.  11. Tricor 145 mg daily.   PAST MEDICAL HISTORY:  Coronary arterial disease, diverticulosis,  arthritis,  hypertension, hypercholesterolemia, gout, gastroesophageal reflux disease,  multiple myeloma for the past two years.   PAST SURGICAL HISTORY:  Coronary artery bypass grafting of three vessels.   SOCIAL HISTORY:  Married.  No tobacco products, very seldom intake of  alcohol.   FAMILY HISTORY:  Father deceased, age 59 with mother deceased age 56 with  cancer.  Has two sisters.   REVIEW OF SYSTEMS:  GENERAL:  No fever, chills or night sweats.  NEUROLOGICAL:  No seizure, syncope or paralysis.  RESPIRATORY:  No shortness  of breath, productive cough or hemoptysis.  CARDIOVASCULAR:  Significant  history of coronary arterial disease with coronary artery bypass graft and  hypertension.  No chest pain, angina or orthopnea.  GASTROINTESTINAL:  No  nausea and vomiting, diarrhea or constipation.  Does have reflux.  GENITOURINARY:  No dysuria, hematuria or discharge.  MUSCULOSKELETAL:  Right  hip found in history of present illness.   PHYSICAL EXAMINATION:  VITAL SIGNS:  Pulse 60, respirations 12, blood  pressure 122/70.  GENERAL:  A 55 year old white male well-nourished, well-developed in no  acute distress although he does have mild  discomfort secondary to his hip  pain.  He is seen ambulating with an antalgic gait.  He is alert and  oriented and cooperative.  HEENT:  Normocephalic, atraumatic.  Pupils equal, round, reactive.  Oropharynx clear.  EOM's are intact.  NECK:  Supple.  CHEST:  Clear anterior and posterior.  The chest reveals no rhonchi, rubs or  wheezing.  HEART:  A regular rate and rhythm.  No murmur is appreciated.  S1 and S2  noted.  ABDOMEN:  Soft, nontender.  Bowel sounds present.  RECTAL/BREASTS/GENITALIA:  Not done, not pertinent to present illness.  EXTREMITIES:  Right hip:  Right hip flexion does show 100 degrees internal  rotation, external rotation of 20 and 30, respectively, abduction of about  30 degrees.  He again does ambulate with slightly antalgic gait due to his  pain.  Left hip motion is normal.   IMPRESSION:  1.  Osteoarthritis, right hip with right acetabular tumor/multiple myeloma      tumor.  2.  Coronary arterial disease.  3.  Diverticulosis.  4.  Hypertension.  5.  Hypercholesterolemia.  6.  Gout.  7.  Gastroesophageal reflux disease.  8.  Multiple myeloma.   PLAN:  The patient will be admitted to [facility] to undergo a  right total hip arthroplasty with excision of acetabular tumor and bone  grafting of that area.  The surgery will be performed by Dr. Ollen Gross.  The patient's cardiologist is Dr. Swaziland.  His medical doctor is Dr. Felipa Eth  and his oncologist is Dr. Donnie Coffin.  They will all be notified and consulted if  needed for medical assistance with the patient throughout the hospital  course.      ALP/MEDQ  D:  02/02/2005  T:  02/02/2005  Job:  [id number]   cc:   Sharene Butters, M.D.   Pierce Crane, M.D.  [address] - [facility]  [place]  [place] [address]  Fax: [phone]   Jeanice Lim, M.D.   [place], M.D.   Bernette Redbird, M.D.  [address]  [place], [place] [address]  Fax: [phone]

## 2011-05-09 NOTE — Discharge Summary (Signed)
[name], [name]              ACCOUNT NO.:  [id number]   MEDICAL RECORD NO.:  [id number]          PATIENT TYPE:  INP   LOCATION:  0472                         FACILITY:  [facility]   PHYSICIAN:  Ollen Gross, M.D.    DATE OF BIRTH:  1956/07/15   DATE OF ADMISSION:  02/03/2005  DATE OF DISCHARGE:  02/07/2005                                 DISCHARGE SUMMARY   ADMISSION DIAGNOSES:  1.  Osteoarthritis, right hip, with right acetabular tumor from multiple      myeloma tumor.  2.  Coronary arterial disease.  3.  Diverticulosis.  4.  Hypertension.  5.  Hypercholesterolemia.  6.  Gout.  7.  Gastroesophageal reflux disease.  8.  Multiple myeloma.   DISCHARGE DIAGNOSES:  1.  Right acetabular tumor status post right total knee arthroplasty with      excision tumor autograft bone grafting.  2.  Coronary arterial disease.  3.  Diverticulosis.  4.  Hypertension.  5.  Hypercholesterolemia.  6.  Gout.  7.  Gastroesophageal reflux disease.  8.  Multiple myeloma.   PROCEDURE:  February 03, 2005 right total hip arthroplasty with excision of  tumor and autograft bone graft. Surgeon Dr. Despina Hick. Assistant Avel Peace,  P.A.-C. Anesthesia general. Blood loss 300 cc. Hemovac drain x2.   BRIEF HISTORY:  [name] is a 55 year old male with a history of  solitary myeloma in the right acetabular region that had aggressively gotten  larger over the past year, intractable pain even with OxyContin and lesions  in area that is amendable to primary excision without doing hip replacement  secondary to the fact that subchondral probably has eroded into the joint.  He now presents for total hip arthroplasty with excision of the lesion,  autograft bone grafting of his femoral head.   CONSULTS:  1.  Cardiology, Dr. Swaziland.  2.  Oncology, Dr. Donnie Coffin.   LABORATORY DATA:  CBC:  Preoperative hemoglobin 13.1, hematocrit 38.6, white  cell count 4.1, differential elevated monos of 12 and remaining  differential  within normal limits. Serial H&Hs were followed. Postoperative hemoglobin  12.0, hemoglobin declined down to level of 9.7, hematocrit 28.3. PT and PTT  preoperatively 12.5 and 30 respectively with INR of 0.9. Serial protimes  followed. Last done PT INR 16.8 and 1.5. Chem panel on admission:  Elevated  glucose of 155, decreased ALP of 10; remaining chem panel all within normal  limits. Serial BMETs were followed. Sodium did drop from 140 to 134 back to  136. Creatinine went from 1.3 to 2.1 and back down to 1.2. BUN remained in  the normal limits. Beta-2 microglobulin serum elevated at 1.88. Serum  immunofixation test taken on February 04, 2005 showed total protein of 7,  IgG of 1,120, IgA of 292, IgM of 74. Serum protein electrophoresis showed  levels of total protein of 7.0, serum albumin of 57.4, alpha 1 globulin 4.7,  alpha 2 globulin 8.1, beta globulin 6.4, beta 2 globulin 5.8, gamma globulin  17.6. Kappa lambda light chains were drawn on February 04, 2005; kappa-free  light chains of 1.62, lambda-free light chains  of 1.05, kappa/lambda-light  chain ratio of 1.54. Urinalysis:  Trace leukocyte esterase with hyaline  casts, granular casts, 0 to 2 white, otherwise negative. Blood group type AB  positive. EKG dated January 29, 2005:  Sinus bradycardia with occasion  premature ectopic complexes, nonspecific ST and T wave abnormalities.  Magnitude of T-wave inversion in V1 and V2 is significant, cannot rule out  ischemia, confirmed by Dr. Jerral Bonito. Right hip films on January 29, 2005:  Stable exam, lucent lesion in the acetabular again noted. Two-view chest on  January 29, 2005:  Questionable primary bone lesions versus healing  fractures bilaterally as described, please correlate clinically. Portable  hip and pelvis films:  Satisfactory appearance of right total hip.   HOSPITAL COURSE:  Admitted to [facility], taken to the OR,  underwent the above procedure without  complication. The patient tolerated  the procedure well and was later sent to the recovery room and then  orthopedic floor. Started on PCA and p.o. analgesics. Consultation by  cardiology postoperatively. Monitor on telemetry for about 24 hours. By day  #1, doing much better. Had a little bit of elevated temperature. Encouraged  incentive spirometry. Hemovac drain placed at time of surgery was pulled.  Once the patient was doing well, transferred over to the floor and proceeded  with physical therapy. PT was consulted postoperative. By day #3, he was  feeling a little bit better. Had some nausea on day #1 and was given  Phenergan and was a little bit sedating. Much better on day #2. Incision was  observed. Dressing changed. Had a little bit of small blistering. Otherwise  incision was healing well. Discontinued IVs and Foleys. Had a little  lightheadedness on day #2 but otherwise no nausea. By day #3, he was doing  much better, still sore. Temperature had spiked up the evening before but  was back down to normal on day #3 at 97.4. Incision was healing well. Did  not have a bowel movement yet but given suppository. Discharge planning  arranged for possible discharge home the following day. Seen by cardiology  on February 06, 2005 and felt stable from a cardiac standpoint and felt  still would be able to go home the following day. Also seen by  hematology/oncology services. They checked labs and serum immunophoresis and  also kappa/lambda light chains. They were drawn from a hematology/oncology  standpoint for multiple myeloma. The patient was seen on the following day  of February 07, 2005 by Dr. Despina Hick, doing well, getting up and moving around  with therapy, tolerating medications, and was discharged home.   DISCHARGE MEDICATIONS AND PLAN:  1.  The patient was discharged home on February 07, 2005.  2.  For discharge diagnosis, please see above. 3.  Discharge medications:  OxyContin,  Robaxin, and Coumadin.  4.  Activity:  Partial weight bearing 25 to 50% right lower extremity, hip      precautions, may start showering, total hip protocol.  5.  Followup:  Two weeks from surgery.  6.  Disposition:  To home.  7.  Diet:  As tolerated.   CONDITION ON DISCHARGE:  Improved.      ALP/MEDQ  D:  03/19/2005  T:  03/19/2005  Job:  [id number]   cc:   Pierce Crane, M.D.  [address] - [facility]  [place]  [place] [address]  Fax: [phone]   Jeanice Lim, M.D.   Peter M. Swaziland, M.D.  [phone]  [place], [place] [address]  Fax: [phone]   Bernette Redbird, M.D.  [address] [place]  [place], [place] [address]  Fax: [phone]

## 2011-05-09 NOTE — Op Note (Signed)
[name], [name]              ACCOUNT NO.:  [id number]   MEDICAL RECORD NO.:  [id number]          PATIENT TYPE:  OUT   LOCATION:  [facility]                         FACILITY:  [facility]   PHYSICIAN:  Bertram Millard. Dahlstedt, M.D.DATE OF BIRTH:  February 19, 1956   DATE OF PROCEDURE:  01/26/2006  DATE OF DISCHARGE:  02/06/2006                                 OPERATIVE REPORT   PREOPERATIVE DIAGNOSIS:  Right hydronephrosis.   POSTOPERATIVE DIAGNOSIS:  Right hydronephrosis.   PRINCIPAL PROCEDURE:  Right double-J stent exchange.   SURGEON:  Bertram Millard. Dahlstedt, M.D.   ANESTHESIA:  General.   COMPLICATIONS:  None.   BRIEF HISTORY:  This middle-aged male has a history of multiple myeloma and  a right pelvic mass. He presented with hydronephrosis last year during a  routine follow-up. He was found to have a right pelvic mass causing this. He  had a stent placed. He presents at this time for stent exchange. He has had  gradual resolution of his mass but is soon to be scheduled for a bone marrow  transplant. It was recommended that he leave a stent until his transplant  hospitalization is over and he is on his way to recovery.   DESCRIPTION OF PROCEDURE:  The patient was administered preoperative IV  antibiotics and taken to the operating room where general anesthetic was  administered. He was placed in the dorsal lithotomy position. Genitalia and  perineum were prepped and draped. A cystoscope was advanced into his bladder  which was found to be normal. No distortion of the base of his bladder was  noted. The right ureteral stent was identified, and extracted. A Polaris  stent 6 French x 24 cm was then placed using fluoroscopic and cystoscopic  guidance. The bladder was drained and the procedure terminated.      Bertram Millard. Dahlstedt, M.D.  Electronically Signed     SMD/MEDQ  D:  02/09/2006  T:  02/09/2006  Job:  [id number]

## 2011-05-23 HISTORY — PX: CORONARY ANGIOPLASTY WITH STENT PLACEMENT: SHX49

## 2011-05-29 ENCOUNTER — Telehealth: Payer: Self-pay | Admitting: Cardiology

## 2011-05-29 NOTE — Telephone Encounter (Signed)
Needs to schedule a follow-up office visit appointment with his cardiologist. Does not want to see Lawson Fiscal. Had chest pains and went to [facility]. Had blood work done at the hospital and was able to rule out a heart attack. Please call back.

## 2011-05-30 ENCOUNTER — Telehealth: Payer: Self-pay | Admitting: Cardiology

## 2011-05-30 NOTE — Telephone Encounter (Signed)
Called to schedule the POST HOSP OV with Dr. Swaziland. Looking for June 18th, 19th, 22nd because she will be off from work and likely have to drive her husband.

## 2011-05-30 NOTE — Telephone Encounter (Signed)
Wife called stating he went to [facility] yest w/ chest pain and was admitted. Had 2 stents. Needs an app w/ [place] in 2 wks. She will have hosp send [place] records.

## 2011-06-02 ENCOUNTER — Telehealth: Payer: Self-pay | Admitting: Cardiology

## 2011-06-02 NOTE — Telephone Encounter (Signed)
Called in wanting to speak to you about his recent heart attack. Please call back.

## 2011-06-02 NOTE — Telephone Encounter (Signed)
Called stating since he had his heart attack and stent last week he feels like it would be better if he continues to see Dr. In [facility] since it is closer. Will fax records to [facility]- Dr. Reuel Boom. [phone]

## 2011-06-09 ENCOUNTER — Ambulatory Visit: Payer: BC Managed Care – PPO | Admitting: Cardiology

## 2011-06-23 ENCOUNTER — Encounter (HOSPITAL_BASED_OUTPATIENT_CLINIC_OR_DEPARTMENT_OTHER): Payer: BC Managed Care – PPO | Admitting: Oncology

## 2011-06-23 DIAGNOSIS — J069 Acute upper respiratory infection, unspecified: Secondary | ICD-10-CM

## 2011-06-23 DIAGNOSIS — D809 Immunodeficiency with predominantly antibody defects, unspecified: Secondary | ICD-10-CM

## 2011-06-23 DIAGNOSIS — Z9484 Stem cells transplant status: Secondary | ICD-10-CM

## 2011-09-22 ENCOUNTER — Other Ambulatory Visit: Payer: Self-pay | Admitting: Oncology

## 2011-09-22 ENCOUNTER — Encounter (HOSPITAL_BASED_OUTPATIENT_CLINIC_OR_DEPARTMENT_OTHER): Payer: BC Managed Care – PPO | Admitting: Oncology

## 2011-09-22 ENCOUNTER — Ambulatory Visit (HOSPITAL_COMMUNITY)
Admission: RE | Admit: 2011-09-22 | Discharge: 2011-09-22 | Disposition: A | Discharge status: Home / Self Care | Payer: BC Managed Care – PPO | Source: Ambulatory Visit | Attending: Oncology | Admitting: Oncology

## 2011-09-22 DIAGNOSIS — J069 Acute upper respiratory infection, unspecified: Secondary | ICD-10-CM

## 2011-09-22 DIAGNOSIS — Z951 Presence of aortocoronary bypass graft: Secondary | ICD-10-CM | POA: Insufficient documentation

## 2011-09-22 DIAGNOSIS — C9000 Multiple myeloma not having achieved remission: Secondary | ICD-10-CM

## 2011-09-22 DIAGNOSIS — C801 Malignant (primary) neoplasm, unspecified: Secondary | ICD-10-CM

## 2011-09-22 DIAGNOSIS — Z9484 Stem cells transplant status: Secondary | ICD-10-CM

## 2011-09-22 DIAGNOSIS — D809 Immunodeficiency with predominantly antibody defects, unspecified: Secondary | ICD-10-CM

## 2011-09-22 LAB — CBC WITH DIFFERENTIAL/PLATELET
Basophils Absolute: 0 10*3/uL (ref 0.0–0.1)
Eosinophils Absolute: 0.1 10*3/uL (ref 0.0–0.5)
HCT: 39 % (ref 38.4–49.9)
HGB: 12.9 g/dL — ABNORMAL LOW (ref 13.0–17.1)
MCH: 29.3 pg (ref 27.2–33.4)
MONO#: 1 10*3/uL — ABNORMAL HIGH (ref 0.1–0.9)
NEUT#: 4.6 10*3/uL (ref 1.5–6.5)
NEUT%: 66.4 % (ref 39.0–75.0)
RDW: 13.8 % (ref 11.0–14.6)
WBC: 6.9 10*3/uL (ref 4.0–10.3)
lymph#: 1.2 10*3/uL (ref 0.9–3.3)

## 2011-09-25 LAB — COMPREHENSIVE METABOLIC PANEL
ALT: 18 U/L (ref 0–53)
ALT: 21 U/L (ref 0–53)
AST: 26 U/L (ref 0–37)
Albumin: 2.8 g/dL — ABNORMAL LOW (ref 3.5–5.2)
Alkaline Phosphatase: 23 U/L — ABNORMAL LOW (ref 39–117)
Alkaline Phosphatase: 25 U/L — ABNORMAL LOW (ref 39–117)
BUN: 24 mg/dL — ABNORMAL HIGH (ref 6–23)
CO2: 25 mEq/L (ref 19–32)
CO2: 25 mEq/L (ref 19–32)
Calcium: 9.1 mg/dL (ref 8.4–10.5)
Creatinine, Ser: 1.8 mg/dL — ABNORMAL HIGH (ref 0.4–1.5)
GFR calc Af Amer: >60 mL/min (ref >60)
GFR calc non Af Amer: 28 mL/min — ABNORMAL LOW (ref >60)
Glucose, Bld: 115 mg/dL — ABNORMAL HIGH (ref 70–99)
Glucose, Bld: 118 mg/dL — ABNORMAL HIGH (ref 70–99)
Potassium: 4 mEq/L (ref 3.5–5.1)
Sodium: 138 mEq/L (ref 135–145)
Sodium: 141 mEq/L (ref 135–145)
Total Bilirubin: 0.7 mg/dL (ref 0.3–1.2)
Total Protein: 5.5 g/dL — ABNORMAL LOW (ref 6.0–8.3)
Total Protein: 5.6 g/dL — ABNORMAL LOW (ref 6.0–8.3)

## 2011-09-25 LAB — CULTURE, RESPIRATORY W GRAM STAIN: Culture: NORMAL

## 2011-09-25 LAB — URINALYSIS, ROUTINE W REFLEX MICROSCOPIC
Glucose, UA: NEGATIVE mg/dL
Specific Gravity, Urine: 1.016 (ref 1.005–1.030)
pH: 5.5 (ref 5.0–8.0)

## 2011-09-25 LAB — EXPECTORATED SPUTUM ASSESSMENT W GRAM STAIN, RFLX TO RESP C

## 2011-09-25 LAB — DIFFERENTIAL
Basophils Absolute: 0 10*3/uL (ref 0.0–0.1)
Basophils Relative: 0 % (ref 0–1)
Eosinophils Absolute: 0 10*3/uL (ref 0.0–0.7)
Monocytes Relative: 16 % — ABNORMAL HIGH (ref 3–12)
Neutro Abs: 4.3 10*3/uL (ref 1.7–7.7)
Neutrophils Relative %: 72 % (ref 43–77)

## 2011-09-25 LAB — CBC
MCHC: 33.6 g/dL (ref 30.0–36.0)
MCV: 90.5 fL (ref 78.0–100.0)
Platelets: 178 10*3/uL (ref 150–400)
RBC: 4.46 MIL/uL (ref 4.22–5.81)

## 2011-09-25 LAB — LEGIONELLA ANTIGEN, URINE: Legionella Antigen, Urine: NEGATIVE

## 2011-09-25 LAB — BASIC METABOLIC PANEL
BUN: 14 mg/dL (ref 6–23)
CO2: 24 mEq/L (ref 19–32)
Calcium: 9.3 mg/dL (ref 8.4–10.5)
Chloride: 104 mEq/L (ref 96–112)
Creatinine, Ser: 2.1 mg/dL — ABNORMAL HIGH (ref 0.4–1.5)
GFR calc Af Amer: 40 mL/min — ABNORMAL LOW (ref >60)

## 2011-09-25 LAB — CULTURE, BLOOD (ROUTINE X 2): Culture: NO GROWTH

## 2011-09-25 LAB — PROTEIN, URINE, 24 HOUR: Urine Total Volume-UPROT: 2050 mL

## 2011-09-25 LAB — LACTATE DEHYDROGENASE: LDH: 209 U/L (ref 94–250)

## 2011-09-25 LAB — IGG, IGA, IGM: IgG (Immunoglobin G), Serum: 599 mg/dL — ABNORMAL LOW (ref 694–1618)

## 2011-09-29 ENCOUNTER — Encounter (HOSPITAL_BASED_OUTPATIENT_CLINIC_OR_DEPARTMENT_OTHER): Payer: BC Managed Care – PPO | Admitting: Oncology

## 2011-09-29 DIAGNOSIS — C9000 Multiple myeloma not having achieved remission: Secondary | ICD-10-CM

## 2011-09-30 LAB — T-HELPER CELL (CD4) - (RCID CLINIC ONLY): CD4 % Helper T Cell: 30 — ABNORMAL LOW

## 2011-10-03 LAB — DIFFERENTIAL
Basophils Absolute: 0
Basophils Relative: 0
Eosinophils Absolute: 0.1
Neutro Abs: 3.9
Neutrophils Relative %: 62

## 2011-10-03 LAB — URINALYSIS, ROUTINE W REFLEX MICROSCOPIC
Glucose, UA: NEGATIVE
Ketones, ur: 15 — AB
Protein, ur: 100 — AB
Urobilinogen, UA: 1

## 2011-10-03 LAB — CBC
HCT: 44.7
Hemoglobin: 15.3
RBC: 4.95
RDW: 14.1 — ABNORMAL HIGH
WBC: 6.3

## 2011-10-03 LAB — COMPREHENSIVE METABOLIC PANEL
ALT: 21
Alkaline Phosphatase: 48
BUN: 30 — ABNORMAL HIGH
CO2: 23
Chloride: 100
GFR calc non Af Amer: 35 — ABNORMAL LOW
Glucose, Bld: 111 — ABNORMAL HIGH
Potassium: 4.8
Sodium: 136
Total Bilirubin: 2.2 — ABNORMAL HIGH

## 2011-10-03 LAB — URINE MICROSCOPIC-ADD ON

## 2011-10-06 LAB — T-HELPER CELLS (CD4) COUNT (NOT AT ARMC): CD4 % Helper T Cell: 27 — ABNORMAL LOW

## 2011-10-23 ENCOUNTER — Encounter: Payer: BC Managed Care – PPO | Admitting: Oncology

## 2011-10-23 DIAGNOSIS — C9001 Multiple myeloma in remission: Secondary | ICD-10-CM

## 2011-10-29 NOTE — Progress Notes (Signed)
This encounter was created in error - please disregard.

## 2011-12-12 ENCOUNTER — Ambulatory Visit: Payer: BC Managed Care – PPO

## 2011-12-29 ENCOUNTER — Ambulatory Visit (HOSPITAL_BASED_OUTPATIENT_CLINIC_OR_DEPARTMENT_OTHER): Payer: BC Managed Care – PPO

## 2011-12-29 ENCOUNTER — Other Ambulatory Visit: Payer: Self-pay | Admitting: Oncology

## 2011-12-29 VITALS — BP 129/77 | HR 51 | Temp 97.9°F

## 2011-12-29 DIAGNOSIS — C9001 Multiple myeloma in remission: Secondary | ICD-10-CM

## 2011-12-29 DIAGNOSIS — I10 Essential (primary) hypertension: Secondary | ICD-10-CM

## 2011-12-29 DIAGNOSIS — D809 Immunodeficiency with predominantly antibody defects, unspecified: Secondary | ICD-10-CM

## 2011-12-29 DIAGNOSIS — C9000 Multiple myeloma not having achieved remission: Secondary | ICD-10-CM

## 2011-12-29 MED ORDER — SODIUM CHLORIDE 0.9 % IV SOLN
Freq: Once | INTRAVENOUS | Status: AC
  Administered 2011-12-29: 11:00:00 via INTRAVENOUS

## 2011-12-29 MED ORDER — DIPHENHYDRAMINE HCL 25 MG PO TABS
25.0000 mg | ORAL_TABLET | Freq: Once | ORAL | Status: AC
  Administered 2011-12-29: 25 mg via ORAL
  Filled 2011-12-29: qty 1

## 2011-12-29 MED ORDER — ACETAMINOPHEN 325 MG PO TABS
650.0000 mg | ORAL_TABLET | Freq: Four times a day (QID) | ORAL | Status: DC | PRN
  Administered 2011-12-29: 650 mg via ORAL

## 2011-12-29 MED ORDER — IMMUNE GLOBULIN (HUMAN) 20 GM/200ML IV SOLN
80.0000 g | Freq: Once | INTRAVENOUS | Status: AC
  Administered 2011-12-29: 80 g via INTRAVENOUS
  Filled 2011-12-29: qty 800

## 2012-02-09 ENCOUNTER — Telehealth: Payer: Self-pay | Admitting: *Deleted

## 2012-02-09 ENCOUNTER — Ambulatory Visit (HOSPITAL_BASED_OUTPATIENT_CLINIC_OR_DEPARTMENT_OTHER): Payer: PRIVATE HEALTH INSURANCE | Admitting: Oncology

## 2012-02-09 ENCOUNTER — Ambulatory Visit (HOSPITAL_BASED_OUTPATIENT_CLINIC_OR_DEPARTMENT_OTHER): Payer: PRIVATE HEALTH INSURANCE | Admitting: Lab

## 2012-02-09 VITALS — BP 146/84 | HR 54 | Temp 98.5°F | Ht 69.0 in | Wt 178.6 lb

## 2012-02-09 DIAGNOSIS — I25810 Atherosclerosis of coronary artery bypass graft(s) without angina pectoris: Secondary | ICD-10-CM

## 2012-02-09 DIAGNOSIS — C9001 Multiple myeloma in remission: Secondary | ICD-10-CM

## 2012-02-09 LAB — CBC WITH DIFFERENTIAL/PLATELET
BASO%: 0.6 % (ref 0.0–2.0)
Basophils Absolute: 0 10*3/uL (ref 0.0–0.1)
HCT: 38.8 % (ref 38.4–49.9)
HGB: 13 g/dL (ref 13.0–17.1)
LYMPH%: 23.9 % (ref 14.0–49.0)
MCH: 30.8 pg (ref 27.2–33.4)
MCHC: 33.5 g/dL (ref 32.0–36.0)
MONO#: 0.6 10*3/uL (ref 0.1–0.9)
NEUT%: 64.6 % (ref 39.0–75.0)
Platelets: 177 10*3/uL (ref 140–400)
WBC: 5.9 10*3/uL (ref 4.0–10.3)
lymph#: 1.4 10*3/uL (ref 0.9–3.3)

## 2012-02-09 NOTE — Telephone Encounter (Signed)
gave patient appointment for 04-19-2012

## 2012-03-03 NOTE — Letter (Signed)
March 01, 2012    [employer]  Attention: Norwood Levo  Re: [name] DOB: [birth year]  NAME:  [name], [name] MRN:  [id number] DOB:  02-12-1956  Dear Dr. Fanny Dance:  I am writing in response to a recent letter regarding Mr. Lizer. I would like to summarize his course for your files. As you probably know, he was diagnosed with multiple plasmacytomas, all of which were consistent with multiple myeloma approximately 5 or 6 years ago. He underwent multiple courses of chemotherapy as well as radiation in an attempt to control this. Ultimately, he underwent a stem cell transplant which fortunately was successful. Since that time, he has developed some significant neuropathy involving his feet, and his hands to a lesser degree. This has limited his ability to ambulate. His course has also been complicated by a myocardial infarction, which he suffered in June 2012. Since that time, his ability to exert himself has also been somewhat impaired despite having a stent placed. He does develop exertional chest pain.  I have seen Mr. Knoth throughout his entire course. He initially was a very active man who worked many hours per day. Since that time, his overall energy level has decreased significantly, and he is quite unable to carry out his normal duties.  I believe that his request for disability is quite valid, and this should be afforded to him given the extent of his disease, his various therapies, and most recently his cardiac issues.  If you have any other questions, please do not hesitate to contact me.  Sincerely,    Pierce Crane, M.D., F.R.C.P.C.  PR/MEDQ  D:  03/01/2012  T:  03/01/2012  Job:  [id number]

## 2012-04-16 ENCOUNTER — Telehealth: Payer: Self-pay | Admitting: Oncology

## 2012-04-16 NOTE — Telephone Encounter (Signed)
lmonvm of the pt regarding his may appt had to be cancelled and r/s to June due to the md will be out of the office

## 2012-04-19 ENCOUNTER — Ambulatory Visit (HOSPITAL_BASED_OUTPATIENT_CLINIC_OR_DEPARTMENT_OTHER): Payer: PRIVATE HEALTH INSURANCE

## 2012-04-19 ENCOUNTER — Other Ambulatory Visit: Payer: PRIVATE HEALTH INSURANCE | Admitting: Lab

## 2012-04-19 VITALS — BP 139/84 | HR 60 | Temp 97.9°F

## 2012-04-19 DIAGNOSIS — J309 Allergic rhinitis, unspecified: Secondary | ICD-10-CM

## 2012-04-19 DIAGNOSIS — R509 Fever, unspecified: Secondary | ICD-10-CM

## 2012-04-19 DIAGNOSIS — J209 Acute bronchitis, unspecified: Secondary | ICD-10-CM

## 2012-04-19 DIAGNOSIS — I10 Essential (primary) hypertension: Secondary | ICD-10-CM

## 2012-04-19 DIAGNOSIS — C9001 Multiple myeloma in remission: Secondary | ICD-10-CM

## 2012-04-19 DIAGNOSIS — Z96649 Presence of unspecified artificial hip joint: Secondary | ICD-10-CM

## 2012-04-19 DIAGNOSIS — I25810 Atherosclerosis of coronary artery bypass graft(s) without angina pectoris: Secondary | ICD-10-CM

## 2012-04-19 DIAGNOSIS — Z5112 Encounter for antineoplastic immunotherapy: Secondary | ICD-10-CM

## 2012-04-19 DIAGNOSIS — G4700 Insomnia, unspecified: Secondary | ICD-10-CM

## 2012-04-19 LAB — CBC WITH DIFFERENTIAL/PLATELET
Eosinophils Absolute: 0.1 10*3/uL (ref 0.0–0.5)
HCT: 41.4 % (ref 38.4–49.9)
LYMPH%: 40.8 % (ref 14.0–49.0)
MONO#: 0.5 10*3/uL (ref 0.1–0.9)
NEUT#: 1.8 10*3/uL (ref 1.5–6.5)
Platelets: 160 10*3/uL (ref 140–400)
RBC: 4.74 10*6/uL (ref 4.20–5.82)
WBC: 4.1 10*3/uL (ref 4.0–10.3)
nRBC: 0 % (ref 0–0)

## 2012-04-19 MED ORDER — DIPHENHYDRAMINE HCL 50 MG/ML IJ SOLN
25.0000 mg | Freq: Once | INTRAMUSCULAR | Status: AC
  Administered 2012-04-19: 25 mg via INTRAVENOUS

## 2012-04-19 MED ORDER — ACETAMINOPHEN 325 MG PO TABS
650.0000 mg | ORAL_TABLET | Freq: Once | ORAL | Status: AC
  Administered 2012-04-19: 650 mg via ORAL

## 2012-04-19 MED ORDER — IMMUNE GLOBULIN (HUMAN) 20 GM/200ML IV SOLN
160.0000 g | Freq: Once | INTRAVENOUS | Status: AC
  Administered 2012-04-19: 160 g via INTRAVENOUS
  Filled 2012-04-19: qty 1600

## 2012-04-19 NOTE — Progress Notes (Signed)
Addended by: Gaylord Shih on: 04/19/2012 11:05 AM   Modules accepted: Orders

## 2012-04-21 ENCOUNTER — Ambulatory Visit: Payer: BC Managed Care – PPO | Admitting: Oncology

## 2012-04-21 LAB — COMPREHENSIVE METABOLIC PANEL
ALT: 14 U/L (ref 0–53)
Albumin: 4.8 g/dL (ref 3.5–5.2)
CO2: 24 mEq/L (ref 19–32)
Calcium: 9.7 mg/dL (ref 8.4–10.5)
Chloride: 106 mEq/L (ref 96–112)
Creatinine, Ser: 1.45 mg/dL — ABNORMAL HIGH (ref 0.50–1.35)
Sodium: 141 mEq/L (ref 135–145)
Total Protein: 6.8 g/dL (ref 6.0–8.3)

## 2012-04-21 LAB — SPEP & IFE WITH QIG
Gamma Globulin: 10.7 % — ABNORMAL LOW (ref 11.1–18.8)
IgA: 327 mg/dL (ref 68–379)
IgG (Immunoglobin G), Serum: 740 mg/dL (ref 650–1600)
IgM, Serum: 45 mg/dL (ref 41–251)
Total Protein, Serum Electrophoresis: 6.8 g/dL (ref 6.0–8.3)

## 2012-04-21 LAB — KAPPA/LAMBDA LIGHT CHAINS: Kappa free light chain: 1.07 mg/dL (ref 0.33–1.94)

## 2012-04-21 LAB — BETA 2 MICROGLOBULIN, SERUM: Beta-2 Microglobulin: 2.07 mg/L — ABNORMAL HIGH (ref 1.01–1.73)

## 2012-04-30 ENCOUNTER — Encounter: Payer: Self-pay | Admitting: *Deleted

## 2012-05-24 ENCOUNTER — Ambulatory Visit: Payer: PRIVATE HEALTH INSURANCE | Admitting: Oncology

## 2012-06-07 ENCOUNTER — Ambulatory Visit (HOSPITAL_BASED_OUTPATIENT_CLINIC_OR_DEPARTMENT_OTHER): Payer: PRIVATE HEALTH INSURANCE | Admitting: Oncology

## 2012-06-07 ENCOUNTER — Telehealth: Payer: Self-pay | Admitting: *Deleted

## 2012-06-07 VITALS — BP 132/86 | HR 75 | Temp 98.9°F | Ht 69.0 in | Wt 175.4 lb

## 2012-06-07 DIAGNOSIS — C9001 Multiple myeloma in remission: Secondary | ICD-10-CM

## 2012-06-07 MED ORDER — IMMUNE GLOBULIN (HUMAN) 10 GM/200ML IV SOLN: 400.0000 mg/kg | Freq: Once | INTRAVENOUS | Status: DC

## 2012-06-07 NOTE — Telephone Encounter (Signed)
Sent michelle and Efraim Kaufmann dale an email set up appointment for 08-09-2012 at 8:00am and 11-07-2012 at 8:00am made patient appointment for 12-07-2012 at 4:00pm with the md

## 2012-06-07 NOTE — Progress Notes (Signed)
ID: [name]  DOB: Mar 28, 1956  MR#: [id number]  CSN#: [id number]   1. Interval History:  Multiple myeloma status post stem cell transplant March 2007 secondary to recurrent plasmacytomas. 2. History of recurrent sinopulmonary infections.   3. History of prophylactic use of IVIG.    4.     history of myocardial infarction June 2012 status post stent placement  ROS: [name] returns for followup. He is been doing well. He's had no real problems with chest discomfort. He is trying to exercise. He did receive Social Security disability. He was denied is on disability. He continues to have numbness in his hands and his feet which limits his ability to do. This activities.  No Known Allergies  Current Outpatient Prescriptions  Medication Sig Dispense Refill  . amLODipine (NORVASC) 10 MG tablet Take 10 mg by mouth daily.      [name] aspirin 81 MG tablet Take 81 mg by mouth daily.      [name] atenolol (TENORMIN) 100 MG tablet Take 100 mg by mouth daily.      [name] atorvastatin (LIPITOR) 10 MG tablet Take 10 mg by mouth daily.      . budesonide-formoterol (SYMBICORT) 80-4.5 MCG/ACT inhaler Inhale 2 puffs into the lungs 2 (two) times daily.      . clopidogrel (PLAVIX) 75 MG tablet Take 75 mg by mouth daily.      [name] esomeprazole (NEXIUM) 40 MG capsule Take 40 mg by mouth daily before breakfast.      . ezetimibe-simvastatin (VYTORIN) 10-10 MG per tablet Take 1 tablet by mouth at bedtime.      . fluticasone (FLONASE) 50 MCG/ACT nasal spray Place 2 sprays into the nose daily.      [name] loratadine (CLARITIN) 10 MG tablet Take 10 mg by mouth daily.      [name] zolpidem (AMBIEN) 10 MG tablet Take 10 mg by mouth at bedtime as needed.      . Choline Fenofibrate (TRILIPIX) 135 MG capsule Take 1 capsule (135 mg total) by mouth daily.  30 capsule  5   Current Facility-Administered Medications  Medication Dose Route Frequency Provider Last Rate Last Dose  . DISCONTD: Immune Globulin 5% (OCTAGAM) SOLN 30 g  400 mg/kg Intravenous Once  Pierce Crane, MD         Objective: Pleasant alert man looking stated age in no acute distress Filed Vitals:   06/07/12 1519  BP: 132/86  Pulse: 75  Temp: 98.9 F (37.2 C)    BMI: Body mass index is 25.90 kg/(m^2).   ECOG FS:  Physical Exam:   Sclerae unicteric  Oropharynx clear  No peripheral adenopathy  Lungs clear -- no rales or rhonchi  Heart regular rate and rhythm  Abdomen benign  MSK no focal spinal tenderness, no peripheral edema  Neuro nonfocal  He pointed out a small area in the anterior portion of his tragus which I suspect may be a small  Salivary gland addition he pointed out a exophytic bony prominence on his left shin.  Lab Results:      Chemistry      Component Value Date/Time   NA 141 04/19/2012 0801   K 3.7 04/19/2012 0801   CL 106 04/19/2012 0801   CO2 24 04/19/2012 0801   BUN 21 04/19/2012 0801   CREATININE 1.45* 04/19/2012 0801      Component Value Date/Time   CALCIUM 9.7 04/19/2012 0801   ALKPHOS 24* 04/19/2012 0801   AST 16 04/19/2012 0801   ALT 14  04/19/2012 0801   BILITOT 0.5 04/19/2012 0801       Lab Results  Component Value Date   WBC 4.1 04/19/2012   HGB 13.9 04/19/2012   HCT 41.4 04/19/2012   MCV 87.3 04/19/2012   PLT 160 04/19/2012   NEUTROABS 1.8 04/19/2012    Studies/Results:  No results found.  Assessment: History of recurrent plasmacytomas status post stem cell transplant now 6 years out in stable condition and in apparent remission based on recent labs. He has not had recurrent sinus and infections and will continue IVIG every 3 months.       [name] 06/07/2012

## 2012-06-08 ENCOUNTER — Telehealth: Payer: Self-pay | Admitting: *Deleted

## 2012-06-08 NOTE — Telephone Encounter (Signed)
Emailed michelle to see if she can change to the patient 08-09-2012 and 11-08-2012 to 8:00am added labs to the both the dates

## 2012-06-08 NOTE — Telephone Encounter (Signed)
Per staff message, I have scheduled appts. JMW  

## 2012-07-13 ENCOUNTER — Other Ambulatory Visit (HOSPITAL_COMMUNITY): Payer: Self-pay | Admitting: Internal Medicine

## 2012-07-13 DIAGNOSIS — R109 Unspecified abdominal pain: Secondary | ICD-10-CM

## 2012-07-14 ENCOUNTER — Encounter (HOSPITAL_COMMUNITY): Payer: Self-pay

## 2012-07-14 ENCOUNTER — Ambulatory Visit (HOSPITAL_COMMUNITY)
Admission: RE | Admit: 2012-07-14 | Discharge: 2012-07-14 | Disposition: A | Discharge status: Home / Self Care | Payer: PRIVATE HEALTH INSURANCE | Source: Ambulatory Visit | Attending: Internal Medicine | Admitting: Internal Medicine

## 2012-07-14 DIAGNOSIS — M5146 Schmorl's nodes, lumbar region: Secondary | ICD-10-CM | POA: Insufficient documentation

## 2012-07-14 DIAGNOSIS — R109 Unspecified abdominal pain: Secondary | ICD-10-CM

## 2012-07-14 DIAGNOSIS — D1809 Hemangioma of other sites: Secondary | ICD-10-CM | POA: Insufficient documentation

## 2012-07-14 DIAGNOSIS — K5730 Diverticulosis of large intestine without perforation or abscess without bleeding: Secondary | ICD-10-CM | POA: Insufficient documentation

## 2012-07-14 DIAGNOSIS — R1032 Left lower quadrant pain: Secondary | ICD-10-CM | POA: Insufficient documentation

## 2012-07-14 DIAGNOSIS — K4090 Unilateral inguinal hernia, without obstruction or gangrene, not specified as recurrent: Secondary | ICD-10-CM | POA: Insufficient documentation

## 2012-07-14 MED ORDER — IOHEXOL 300 MG/ML  SOLN
100.0000 mL | Freq: Once | INTRAMUSCULAR | Status: AC | PRN
  Administered 2012-07-14: 100 mL via INTRAVENOUS

## 2012-08-09 ENCOUNTER — Other Ambulatory Visit: Payer: Self-pay | Admitting: Oncology

## 2012-08-09 ENCOUNTER — Ambulatory Visit (HOSPITAL_BASED_OUTPATIENT_CLINIC_OR_DEPARTMENT_OTHER): Payer: PRIVATE HEALTH INSURANCE

## 2012-08-09 ENCOUNTER — Other Ambulatory Visit (HOSPITAL_BASED_OUTPATIENT_CLINIC_OR_DEPARTMENT_OTHER): Payer: PRIVATE HEALTH INSURANCE | Admitting: Lab

## 2012-08-09 VITALS — BP 135/74 | HR 55 | Temp 98.3°F | Resp 18

## 2012-08-09 DIAGNOSIS — C9001 Multiple myeloma in remission: Secondary | ICD-10-CM

## 2012-08-09 DIAGNOSIS — D809 Immunodeficiency with predominantly antibody defects, unspecified: Secondary | ICD-10-CM

## 2012-08-09 LAB — CBC WITH DIFFERENTIAL/PLATELET
BASO%: 0.6 % (ref 0.0–2.0)
EOS%: 2.1 % (ref 0.0–7.0)
MCH: 29.2 pg (ref 27.2–33.4)
MCHC: 33.8 g/dL (ref 32.0–36.0)
RDW: 13.9 % (ref 11.0–14.6)
WBC: 4.8 10*3/uL (ref 4.0–10.3)
lymph#: 2 10*3/uL (ref 0.9–3.3)
nRBC: 0 % (ref 0–0)

## 2012-08-09 MED ORDER — ACETAMINOPHEN 325 MG PO TABS
650.0000 mg | ORAL_TABLET | Freq: Four times a day (QID) | ORAL | Status: DC | PRN
  Administered 2012-08-09: 650 mg via ORAL

## 2012-08-09 MED ORDER — SODIUM CHLORIDE 0.9 % IV SOLN
Freq: Once | INTRAVENOUS | Status: AC
  Administered 2012-08-09: 09:00:00 via INTRAVENOUS

## 2012-08-09 MED ORDER — IMMUNE GLOBULIN (HUMAN) 20 GM/200ML IV SOLN
80.0000 g | Freq: Once | INTRAVENOUS | Status: AC
  Administered 2012-08-09: 80 g via INTRAVENOUS
  Filled 2012-08-09: qty 800

## 2012-08-09 MED ORDER — DIPHENHYDRAMINE HCL 25 MG PO TABS
25.0000 mg | ORAL_TABLET | Freq: Four times a day (QID) | ORAL | Status: DC | PRN
  Administered 2012-08-09: 25 mg via ORAL
  Filled 2012-08-09: qty 1

## 2012-08-09 NOTE — Patient Instructions (Addendum)
[facility] Discharge Instructions for Patients Receiving Chemotherapy  Today you received the following 10% Privigen  To help prevent nausea and vomiting after your treatment, we encourage you to take your nausea medication  Begin taking it at 7 pm and take it as often as prescribed for the next 24 to 72 hours.   If you develop nausea and vomiting that is not controlled by your nausea medication, call the clinic. If it is after clinic hours your family physician or the after hours number for the clinic or go to the Emergency Department.   BELOW ARE SYMPTOMS THAT SHOULD BE REPORTED IMMEDIATELY:  *FEVER GREATER THAN 100.5 F  *CHILLS WITH OR WITHOUT FEVER  NAUSEA AND VOMITING THAT IS NOT CONTROLLED WITH YOUR NAUSEA MEDICATION  *UNUSUAL SHORTNESS OF BREATH  *UNUSUAL BRUISING OR BLEEDING  TENDERNESS IN MOUTH AND THROAT WITH OR WITHOUT PRESENCE OF ULCERS  *URINARY PROBLEMS  *BOWEL PROBLEMS  UNUSUAL RASH Items with * indicate a potential emergency and should be followed up as soon as possible.  One of the nurses will contact you 24 hours after your treatment. Please let the nurse know about any problems that you may have experienced. Feel free to call the clinic you have any questions or concerns. The clinic phone number is [phone].   I have been informed and understand all the instructions given to me. I know to contact the clinic, my physician, or go to the Emergency Department if any problems should occur. I do not have any questions at this time, but understand that I may call the clinic during office hours   should I have any questions or need assistance in obtaining follow up care.    __________________________________________  _____________  __________ Signature of Patient or Authorized Representative            Date                   Time    __________________________________________ Nurse's Signature

## 2012-08-11 LAB — COMPREHENSIVE METABOLIC PANEL
ALT: 12 U/L (ref 0–53)
AST: 15 U/L (ref 0–37)
Albumin: 4.3 g/dL (ref 3.5–5.2)
Alkaline Phosphatase: 27 U/L — ABNORMAL LOW (ref 39–117)
Potassium: 4.1 mEq/L (ref 3.5–5.3)
Sodium: 140 mEq/L (ref 135–145)
Total Protein: 7.1 g/dL (ref 6.0–8.3)

## 2012-08-11 LAB — SPEP & IFE WITH QIG
Albumin ELP: 59.2 % (ref 55.8–66.1)
Alpha-1-Globulin: 4.6 % (ref 2.9–4.9)
Alpha-2-Globulin: 12 % — ABNORMAL HIGH (ref 7.1–11.8)
Beta 2: 6 % (ref 3.2–6.5)
Beta Globulin: 8 % — ABNORMAL HIGH (ref 4.7–7.2)
IgM, Serum: 47 mg/dL (ref 41–251)

## 2012-08-11 LAB — KAPPA/LAMBDA LIGHT CHAINS: Kappa:Lambda Ratio: 0.94 (ref 0.26–1.65)

## 2012-08-18 ENCOUNTER — Encounter: Payer: Self-pay | Admitting: Internal Medicine

## 2012-09-17 ENCOUNTER — Ambulatory Visit: Payer: PRIVATE HEALTH INSURANCE | Admitting: Internal Medicine

## 2012-09-28 ENCOUNTER — Telehealth: Payer: Self-pay

## 2012-09-28 ENCOUNTER — Ambulatory Visit (INDEPENDENT_AMBULATORY_CARE_PROVIDER_SITE_OTHER): Payer: PRIVATE HEALTH INSURANCE | Admitting: Internal Medicine

## 2012-09-28 ENCOUNTER — Encounter: Payer: Self-pay | Admitting: Internal Medicine

## 2012-09-28 VITALS — BP 130/70 | HR 64 | Ht 69.0 in | Wt 174.0 lb

## 2012-09-28 DIAGNOSIS — K5732 Diverticulitis of large intestine without perforation or abscess without bleeding: Secondary | ICD-10-CM

## 2012-09-28 DIAGNOSIS — R194 Change in bowel habit: Secondary | ICD-10-CM

## 2012-09-28 DIAGNOSIS — R198 Other specified symptoms and signs involving the digestive system and abdomen: Secondary | ICD-10-CM

## 2012-09-28 DIAGNOSIS — Z86010 Personal history of colonic polyps: Secondary | ICD-10-CM

## 2012-09-28 MED ORDER — NA SULFATE-K SULFATE-MG SULF 17.5-3.13-1.6 GM/177ML PO SOLN: ORAL | Status: DC

## 2012-09-28 NOTE — Telephone Encounter (Signed)
No message, created in error

## 2012-09-28 NOTE — Patient Instructions (Addendum)
You have been scheduled for a colonoscopy with propofol. Please follow written instructions given to you at your visit today.  Please pick up your prep kit at the pharmacy within the next 1-3 days. If you use inhalers (even only as needed), please bring them with you on the day of your procedure.  You will be contaced by our office prior to your procedure for directions on holding your Plavix.  If you do not hear from our office 1 week prior to your scheduled procedure, please call [phone] to discuss.  We will obtain your records from Dr. Jennye Boroughs office using the ROI you signed today.  Thank you for choosing me and [facility].  Iva Boop, M.D., [facility]

## 2012-09-28 NOTE — Progress Notes (Signed)
Subjective:    Patient ID: [name], male    DOB: [birth year], 56 y.o.   MRN: [id number]  HPI This middle-aged white man has a history of multiple myeloma and stem cell transplant. He also has a reported history of colon polyps, removed by Dr. Kinnie Scales area last colonoscopy reportedly in 2009. Records requested. The patient recalls being told to have a repeat routine colonoscopy in 2014.  More recently, this summer, he had lower abdominal pain and fever and CT scan showed distal descending colon diverticulitis in the setting of severe left-sided diverticulosis.. Was prescribed Cipro and metronidazole he said his stomach was toward up by the treatment. Doesn't really had more problems and took a round of Cipro only and now for the past 3 weeks he says he is felt normal without abdominal pain. He reports a long-standing history of intermittent episodes of diverticulitis going back 20-25 years, with antibiotic use. He recently restricted seeds and nuts and other foods typically associated with diverticulitis because of his recurrent episodes now.  A few months prior to this he did develop a change in his bowel habits from one normal formed stool a day to having 3 times a week aggressively softer and looser stools throughout the day. He does not report bleeding.  He does have a history of reflux and says medication controls that well.  No Known Allergies Outpatient Prescriptions Prior to Visit  Medication Sig Dispense Refill  . amLODipine (NORVASC) 10 MG tablet Take 10 mg by mouth daily.      [name] aspirin 81 MG tablet Take 81 mg by mouth daily.      [name] atenolol (TENORMIN) 100 MG tablet Take 100 mg by mouth daily.      . budesonide-formoterol (SYMBICORT) 80-4.5 MCG/ACT inhaler Inhale 2 puffs into the lungs 2 (two) times daily.      . clopidogrel (PLAVIX) 75 MG tablet Take 75 mg by mouth daily.      [name] ezetimibe-simvastatin (VYTORIN) 10-10 MG per tablet Take 1 tablet by mouth at bedtime.      .  fluticasone (FLONASE) 50 MCG/ACT nasal spray Place 2 sprays into the nose daily.      [name] loratadine (CLARITIN) 10 MG tablet Take 10 mg by mouth daily.      [name] zolpidem (AMBIEN) 10 MG tablet Take 10 mg by mouth at bedtime as needed.      [name] atorvastatin (LIPITOR) 10 MG tablet Take 10 mg by mouth daily.      . Choline Fenofibrate (TRILIPIX) 135 MG capsule Take 1 capsule (135 mg total) by mouth daily.  30 capsule  5  . esomeprazole (NEXIUM) 40 MG capsule Take 40 mg by mouth daily before breakfast.       Past Medical History  Diagnosis Date  . multiple myeloma dx'd 2005    chemo/xrt comp 2006  . Plasmocytoma dx'd 2007    IVIG treatment in progress  . CAD (coronary artery disease)   . Chronic renal insufficiency   . HTN (hypertension)   . Hypercholesteremia   . Diverticulitis   . Gout   . GERD (gastroesophageal reflux disease)   . Hx of stem cell transplant 02/2006  . Peripheral neuropathy     chronic  . B12 deficiency   . MI (myocardial infarction)   . Rosacea   . Recurrent infections     after stem cell transplant - on Iv IgG   Past Surgical History  Procedure Date  . Coronary artery bypass graft 08/19/1993  [facility] in [place], [place]  . Colonoscopy w/ polypectomy   . Lasik 10/2009    Dr. Nile Riggs  . Total hip arthroplasty 02/03/2005    right  . Stent placement for hydronephrosis 11/2004    Dr. Retta Diones  . Hernia repair 09/2010    left, Dr. Dwain Sarna  . Coronary angioplasty with stent placement 05/2011    Dr. Reuel Boom in [facility]   History   Social History  . Marital Status: Married    Spouse Name: N/A    Number of Children: 1  . Years of Education: N/A   Occupational History  . disabled    Social History Main Topics  . Smoking status: Former Smoker    Quit date: 12/22/1992  . Smokeless tobacco: Never Used  . Alcohol Use: 0.0 oz/week  . Drug Use: No  . Sexually Active: None   Other Topics Concern  . None   Social History Narrative   Patient is  married with one sonOn disabilityDoes use alcohol, former smoker, 2 caffeinated beverages dailyUpdated 09/28/2012   Family History  Problem Relation Age of Onset  . Heart attack Father 58    x 2  . Colon cancer Mother   . Colon polyps Mother       Review of Systems He denies other problems other than that mentioned in the history of present illness at this time.    Objective:   Physical Exam General:  Well-developed, well-nourished and in no acute distress Eyes:  anicteric. ENT:   Mouth and posterior pharynx free of lesions.  Neck:   supple w/o thyromegaly or mass.  Lungs: Clear to auscultation bilaterally. Heart:  S1S2, no rubs, murmurs, gallops. Abdomen:  soft, non-tender, no hepatosplenomegaly, hernia, or mass and BS+.  Rectal: deferred Lymph:  no cervical or supraclavicular adenopathy. Extremities:   no edema Skin   no rash. Neuro:  A&O x 3.  Psych:  appropriate mood and  Affect.   Data Reviewed: July CT scan report CBC 07/12/2012, normal metabolic panel normal urinalysis negative       Assessment & Plan:   1. Diverticulitis large intestine   2. Change in bowel habits   3. Personal history of colonic polyps    These problems occur in the setting of Plavix use for coronary artery disease and previous stenting.  1. Given the overall scenario especially with change in bowel habits will proceed with colonoscopy. Risks benefits and indications are reviewed and he understands agrees to proceed. 2. Will have Plavix hold 5-7 days, assuming it is okay with his cardiologist. We will check with Dr. Reuel Boom. 3. Further plans pending these results, I will also request and review records of his previous colonoscopy reports. He has a family history of colon cancer though his mother was elderly, in her 78s when this was diagnosed a sounds like.   CC: Hoyle Sauer, MD

## 2012-09-29 ENCOUNTER — Encounter: Payer: Self-pay | Admitting: Internal Medicine

## 2012-09-30 ENCOUNTER — Telehealth: Payer: Self-pay

## 2012-09-30 NOTE — Telephone Encounter (Signed)
Spoke to patient to let him know that Dr. Reuel Boom at [facility] has ok'ed for him to come off his Plavix for 5 days prior to colonoscopy.  Then to restart immediately post procedure.  He has changed his colonoscopy appt. To 11/01/12 at 1:30pm, he request I send out a new copy of the prep with correct dates and times.  I will mail this to his home.  Once he gets it he will call us with any questions.

## 2012-10-12 ENCOUNTER — Encounter: Payer: PRIVATE HEALTH INSURANCE | Admitting: Internal Medicine

## 2012-10-27 ENCOUNTER — Encounter: Payer: Self-pay | Admitting: Cardiology

## 2012-11-01 ENCOUNTER — Ambulatory Visit (AMBULATORY_SURGERY_CENTER): Payer: PRIVATE HEALTH INSURANCE | Admitting: Internal Medicine

## 2012-11-01 ENCOUNTER — Encounter: Payer: Self-pay | Admitting: Internal Medicine

## 2012-11-01 VITALS — BP 146/77 | HR 48 | Temp 96.3°F | Resp 11 | Ht 69.0 in | Wt 174.0 lb

## 2012-11-01 DIAGNOSIS — K5730 Diverticulosis of large intestine without perforation or abscess without bleeding: Secondary | ICD-10-CM

## 2012-11-01 DIAGNOSIS — R198 Other specified symptoms and signs involving the digestive system and abdomen: Secondary | ICD-10-CM

## 2012-11-01 DIAGNOSIS — D126 Benign neoplasm of colon, unspecified: Secondary | ICD-10-CM

## 2012-11-01 DIAGNOSIS — Z860100 Personal history of colon polyps, unspecified: Secondary | ICD-10-CM

## 2012-11-01 DIAGNOSIS — K620 Anal polyp: Secondary | ICD-10-CM

## 2012-11-01 DIAGNOSIS — K621 Rectal polyp: Secondary | ICD-10-CM

## 2012-11-01 HISTORY — DX: Personal history of colonic polyps: Z86.010

## 2012-11-01 MED ORDER — SODIUM CHLORIDE 0.9 % IV SOLN: 500.0000 mL | INTRAVENOUS | Status: DC

## 2012-11-01 NOTE — Patient Instructions (Addendum)
Two benign-appearing polyps were removed. I will let you know the results and plans. You have severe diverticulosis (as you know). One way to cure you of recurrent diverticulitis is to have the area of colon affected removed by surgery. This is major surgery and I am not certain if benefits outweigh risks in you, but you could meet with a surgeon about this if desired.  Thank you for choosing me and [facility].  Iva Boop, MD, [facility]  Discharge instructions given with verbal understanding. Handouts on polyps and diverticulosis given. Resume previous medications. YOU HAD AN ENDOSCOPIC PROCEDURE TODAY AT THE  ENDOSCOPY CENTER: Refer to the procedure report that was given to you for any specific questions about what was found during the examination.  If the procedure report does not answer your questions, please call your gastroenterologist to clarify.  If you requested that your care partner not be given the details of your procedure findings, then the procedure report has been included in a sealed envelope for you to review at your convenience later.  YOU SHOULD EXPECT: Some feelings of bloating in the abdomen. Passage of more gas than usual.  Walking can help get rid of the air that was put into your GI tract during the procedure and reduce the bloating. If you had a lower endoscopy (such as a colonoscopy or flexible sigmoidoscopy) you may notice spotting of blood in your stool or on the toilet paper. If you underwent a bowel prep for your procedure, then you may not have a normal bowel movement for a few days.  DIET: Your first meal following the procedure should be a light meal and then it is ok to progress to your normal diet.  A half-sandwich or bowl of soup is an example of a good first meal.  Heavy or fried foods are harder to digest and may make you feel nauseous or bloated.  Likewise meals heavy in dairy and vegetables can cause extra gas to form and this can also  increase the bloating.  Drink plenty of fluids but you should avoid alcoholic beverages for 24 hours.  ACTIVITY: Your care partner should take you home directly after the procedure.  You should plan to take it easy, moving slowly for the rest of the day.  You can resume normal activity the day after the procedure however you should NOT DRIVE or use heavy machinery for 24 hours (because of the sedation medicines used during the test).    SYMPTOMS TO REPORT IMMEDIATELY: A gastroenterologist can be reached at any hour.  During normal business hours, 8:30 AM to 5:00 PM Monday through Friday, call [phone].  After hours and on weekends, please call the GI answering service at [phone] who will take a message and have the physician on call contact you.   Following lower endoscopy (colonoscopy or flexible sigmoidoscopy):  Excessive amounts of blood in the stool  Significant tenderness or worsening of abdominal pains  Swelling of the abdomen that is new, acute  Fever of 100F or higher  FOLLOW UP: If any biopsies were taken you will be contacted by phone or by letter within the next 1-3 weeks.  Call your gastroenterologist if you have not heard about the biopsies in 3 weeks.  Our staff will call the home number listed on your records the next business day following your procedure to check on you and address any questions or concerns that you may have at that time regarding the information given to  you following your procedure. This is a courtesy call and so if there is no answer at the home number and we have not heard from you through the emergency physician on call, we will assume that you have returned to your regular daily activities without incident.  SIGNATURES/CONFIDENTIALITY: You and/or your care partner have signed paperwork which will be entered into your electronic medical record.  These signatures attest to the fact that that the information above on your After Visit Summary has  been reviewed and is understood.  Full responsibility of the confidentiality of this discharge information lies with you and/or your care-partner.

## 2012-11-01 NOTE — Progress Notes (Signed)
Patient did not experience any of the following events: a burn prior to discharge; a fall within the facility; wrong site/side/patient/procedure/implant event; or a hospital transfer or hospital admission upon discharge from the facility. (G8907) Patient did not have preoperative order for IV antibiotic SSI prophylaxis. (G8918)  

## 2012-11-01 NOTE — Op Note (Signed)
[facility] [address] [place], [address]   COLONOSCOPY PROCEDURE REPORT  PATIENT: [name], [name]  MR#: [id number] BIRTHDATE: 05/29/1956 , 56  yrs. old GENDER: Male ENDOSCOPIST: Iva Boop, MD, [facility] REFERRED GN:FAOZ, Ravisankar Avva, [place] PROCEDURE DATE:  11/01/2012 PROCEDURE:   Colonoscopy with snare polypectomy ASA CLASS:   Class II INDICATIONS:change in bowel habits. MEDICATIONS: propofol (Diprivan) 250mg  IV, These medications were titrated to patient response per physician's verbal order, and MAC sedation, administered by CRNA  DESCRIPTION OF PROCEDURE:   After the risks benefits and alternatives of the procedure were thoroughly explained, informed consent was obtained.  A digital rectal exam revealed no abnormalities of the rectum and A digital rectal exam revealed the prostate was not enlarged.   The LB CF-[id number]  endoscope was introduced through the anus and advanced to the cecum, which was identified by both the appendix and ileocecal valve. No adverse events experienced.   The quality of the prep was Suprep excellent The instrument was then slowly withdrawn as the colon was fully examined.      COLON FINDINGS: Two polypoid shaped sessile polyps measuring 7-10 mm in size were found in the sigmoid colon and rectum.  A polypectomy was performed using snare cautery.  The resection was complete and the polyp tissue was completely retrieved.   There was severe diverticulosis noted in the left colon with associated muscular hypertrophy and luminal narrowing.   The colon mucosa was otherwise normal.  Retroflexed views revealed no abnormalities. The time to cecum=3 minutes 16 seconds.  Withdrawal time=11 minutes 07 seconds. The scope was withdrawn and the procedure completed. COMPLICATIONS: There were no complications.  ENDOSCOPIC IMPRESSION: 1.   Two sessile polyps measuring 7-10 mm in size were found in the sigmoid colon and  rectum; polypectomy was performed using snare cautery 2.   There was severe diverticulosis noted in the left colon 3.   The colon mucosa was otherwise normal with excellent prep 4.    he has a history of polyp(s) in 2009  RECOMMENDATIONS: Timing of repeat colonoscopy will be determined by pathology findings. Restart Plavix in AM  eSigned:  Iva Boop, MD, [facility] 11/01/2012 2:12 PM   cc: Chilton Greathouse, MD and The Patient

## 2012-11-01 NOTE — Progress Notes (Signed)
Propofol per B Walton CRNA. EWM 

## 2012-11-02 ENCOUNTER — Telehealth: Payer: Self-pay

## 2012-11-02 NOTE — Telephone Encounter (Signed)
  Follow up Call-  Call back number 11/01/2012  Post procedure Call Back phone  # [phone]  Permission to leave phone message Yes     Patient questions:  Do you have a fever, pain , or abdominal swelling? no Pain Score  0 *  Have you tolerated food without any problems? yes  Have you been able to return to your normal activities? yes  Do you have any questions about your discharge instructions: Diet   no Medications  no Follow up visit  no  Do you have questions or concerns about your Care? no  Actions: * If pain score is 4 or above: No action needed, pain <4.   The pt asked when he could start metamucil.  I advised him to start today. Maw

## 2012-11-04 ENCOUNTER — Encounter: Payer: Self-pay | Admitting: Internal Medicine

## 2012-11-05 ENCOUNTER — Other Ambulatory Visit: Payer: Self-pay | Admitting: *Deleted

## 2012-11-05 DIAGNOSIS — C9001 Multiple myeloma in remission: Secondary | ICD-10-CM

## 2012-11-08 ENCOUNTER — Other Ambulatory Visit (HOSPITAL_BASED_OUTPATIENT_CLINIC_OR_DEPARTMENT_OTHER): Payer: PRIVATE HEALTH INSURANCE | Admitting: Lab

## 2012-11-08 ENCOUNTER — Ambulatory Visit (HOSPITAL_BASED_OUTPATIENT_CLINIC_OR_DEPARTMENT_OTHER): Payer: PRIVATE HEALTH INSURANCE

## 2012-11-08 VITALS — BP 143/85 | HR 50 | Temp 97.9°F

## 2012-11-08 DIAGNOSIS — Z96649 Presence of unspecified artificial hip joint: Secondary | ICD-10-CM

## 2012-11-08 DIAGNOSIS — I10 Essential (primary) hypertension: Secondary | ICD-10-CM

## 2012-11-08 DIAGNOSIS — G4700 Insomnia, unspecified: Secondary | ICD-10-CM

## 2012-11-08 DIAGNOSIS — C9001 Multiple myeloma in remission: Secondary | ICD-10-CM

## 2012-11-08 DIAGNOSIS — Z860100 Personal history of colon polyps, unspecified: Secondary | ICD-10-CM

## 2012-11-08 DIAGNOSIS — I25810 Atherosclerosis of coronary artery bypass graft(s) without angina pectoris: Secondary | ICD-10-CM

## 2012-11-08 DIAGNOSIS — J309 Allergic rhinitis, unspecified: Secondary | ICD-10-CM

## 2012-11-08 DIAGNOSIS — D809 Immunodeficiency with predominantly antibody defects, unspecified: Secondary | ICD-10-CM

## 2012-11-08 LAB — COMPREHENSIVE METABOLIC PANEL (CC13)
Albumin: 3.8 g/dL (ref 3.5–5.0)
Alkaline Phosphatase: 27 U/L — ABNORMAL LOW (ref 40–150)
CO2: 28 mEq/L (ref 22–29)
Calcium: 9.9 mg/dL (ref 8.4–10.4)
Chloride: 106 mEq/L (ref 98–107)
Glucose: 87 mg/dl (ref 70–99)
Potassium: 4.1 mEq/L (ref 3.5–5.1)
Sodium: 139 mEq/L (ref 136–145)
Total Protein: 10.1 g/dL — ABNORMAL HIGH (ref 6.4–8.3)

## 2012-11-08 LAB — CBC WITH DIFFERENTIAL/PLATELET
BASO%: 0.5 % (ref 0.0–2.0)
EOS%: 2 % (ref 0.0–7.0)
HGB: 13.5 g/dL (ref 13.0–17.1)
MCH: 29.7 pg (ref 27.2–33.4)
MCV: 89 fL (ref 79.3–98.0)
MONO%: 14.8 % — ABNORMAL HIGH (ref 0.0–14.0)
RBC: 4.55 10*6/uL (ref 4.20–5.82)
RDW: 13.8 % (ref 11.0–14.6)
lymph#: 1.7 10*3/uL (ref 0.9–3.3)
nRBC: 0 % (ref 0–0)

## 2012-11-08 MED ORDER — ACETAMINOPHEN 325 MG PO TABS
650.0000 mg | ORAL_TABLET | Freq: Four times a day (QID) | ORAL | Status: DC | PRN
  Administered 2012-11-08: 650 mg via ORAL

## 2012-11-08 MED ORDER — DIPHENHYDRAMINE HCL 25 MG PO TABS
25.0000 mg | ORAL_TABLET | Freq: Once | ORAL | Status: AC
  Administered 2012-11-08: 25 mg via ORAL
  Filled 2012-11-08: qty 1

## 2012-11-08 MED ORDER — SODIUM CHLORIDE 0.9 % IV SOLN
Freq: Once | INTRAVENOUS | Status: AC
  Administered 2012-11-08: 08:00:00 via INTRAVENOUS

## 2012-11-08 MED ORDER — IMMUNE GLOBULIN (HUMAN) 20 GM/200ML IV SOLN
80.0000 g | Freq: Once | INTRAVENOUS | Status: AC
  Administered 2012-11-08: 80 g via INTRAVENOUS
  Filled 2012-11-08: qty 800

## 2012-11-08 NOTE — Patient Instructions (Addendum)
[facility]   Today you received the following agents: Privigen.   BELOW ARE SYMPTOMS THAT SHOULD BE REPORTED IMMEDIATELY:  *FEVER GREATER THAN 100.5 F  *CHILLS WITH OR WITHOUT FEVER  NAUSEA AND VOMITING THAT IS NOT CONTROLLED WITH YOUR NAUSEA MEDICATION  *UNUSUAL SHORTNESS OF BREATH  *UNUSUAL BRUISING OR BLEEDING  TENDERNESS IN MOUTH AND THROAT WITH OR WITHOUT PRESENCE OF ULCERS  *URINARY PROBLEMS  *BOWEL PROBLEMS  UNUSUAL RASH Items with * indicate a potential emergency and should be followed up as soon as possible.  Feel free to call the clinic you have any questions or concerns. The clinic phone number is [phone].

## 2012-11-09 ENCOUNTER — Ambulatory Visit (HOSPITAL_COMMUNITY): Admit: 2012-11-09 | Payer: Self-pay | Admitting: Gastroenterology

## 2012-11-09 ENCOUNTER — Observation Stay (HOSPITAL_COMMUNITY)
Admission: EM | Admit: 2012-11-09 | Discharge: 2012-11-10 | Disposition: A | Discharge status: Home / Self Care | Payer: PRIVATE HEALTH INSURANCE | Attending: Emergency Medicine | Admitting: Emergency Medicine

## 2012-11-09 ENCOUNTER — Encounter (HOSPITAL_COMMUNITY)
Admission: EM | Disposition: A | Discharge status: Home / Self Care | Payer: Self-pay | Source: Home / Self Care | Attending: Emergency Medicine

## 2012-11-09 ENCOUNTER — Encounter (HOSPITAL_COMMUNITY): Payer: Self-pay

## 2012-11-09 DIAGNOSIS — I2510 Atherosclerotic heart disease of native coronary artery without angina pectoris: Secondary | ICD-10-CM | POA: Insufficient documentation

## 2012-11-09 DIAGNOSIS — K5730 Diverticulosis of large intestine without perforation or abscess without bleeding: Secondary | ICD-10-CM | POA: Insufficient documentation

## 2012-11-09 DIAGNOSIS — K625 Hemorrhage of anus and rectum: Secondary | ICD-10-CM | POA: Insufficient documentation

## 2012-11-09 DIAGNOSIS — Z7902 Long term (current) use of antithrombotics/antiplatelets: Secondary | ICD-10-CM | POA: Insufficient documentation

## 2012-11-09 DIAGNOSIS — I10 Essential (primary) hypertension: Secondary | ICD-10-CM

## 2012-11-09 DIAGNOSIS — D62 Acute posthemorrhagic anemia: Secondary | ICD-10-CM | POA: Insufficient documentation

## 2012-11-09 DIAGNOSIS — Z86010 Personal history of colonic polyps: Secondary | ICD-10-CM

## 2012-11-09 DIAGNOSIS — Y838 Other surgical procedures as the cause of abnormal reaction of the patient, or of later complication, without mention of misadventure at the time of the procedure: Secondary | ICD-10-CM | POA: Insufficient documentation

## 2012-11-09 DIAGNOSIS — IMO0002 Reserved for concepts with insufficient information to code with codable children: Principal | ICD-10-CM | POA: Insufficient documentation

## 2012-11-09 DIAGNOSIS — K626 Ulcer of anus and rectum: Secondary | ICD-10-CM | POA: Insufficient documentation

## 2012-11-09 HISTORY — PX: FLEXIBLE SIGMOIDOSCOPY: SHX5431

## 2012-11-09 LAB — BASIC METABOLIC PANEL
BUN: 21 mg/dL (ref 6–23)
Chloride: 101 mEq/L (ref 96–112)
GFR calc Af Amer: 63 mL/min — ABNORMAL LOW (ref >90)
Glucose, Bld: 97 mg/dL (ref 70–99)
Potassium: 3.8 mEq/L (ref 3.5–5.1)

## 2012-11-09 LAB — CBC WITH DIFFERENTIAL/PLATELET
Basophils Relative: 1 % (ref 0–1)
HCT: 38.5 % — ABNORMAL LOW (ref 39.0–52.0)
Hemoglobin: 12.9 g/dL — ABNORMAL LOW (ref 13.0–17.0)
Lymphocytes Relative: 38 % (ref 12–46)
Lymphs Abs: 1.2 10*3/uL (ref 0.7–4.0)
Monocytes Relative: 17 % — ABNORMAL HIGH (ref 3–12)
Neutro Abs: 1.4 10*3/uL — ABNORMAL LOW (ref 1.7–7.7)
Neutrophils Relative %: 42 % — ABNORMAL LOW (ref 43–77)
RBC: 4.33 MIL/uL (ref 4.22–5.81)
WBC: 3.3 10*3/uL — ABNORMAL LOW (ref 4.0–10.5)

## 2012-11-09 LAB — OCCULT BLOOD, POC DEVICE: Fecal Occult Bld: POSITIVE

## 2012-11-09 LAB — PROTIME-INR: INR: 0.94 (ref 0.00–1.49)

## 2012-11-09 LAB — CBC
HCT: 35.9 % — ABNORMAL LOW (ref 39.0–52.0)
MCH: 29.9 pg (ref 26.0–34.0)
MCHC: 34 g/dL (ref 30.0–36.0)
RDW: 13.5 % (ref 11.5–15.5)

## 2012-11-09 SURGERY — SIGMOIDOSCOPY, FLEXIBLE
Anesthesia: Moderate Sedation

## 2012-11-09 MED ORDER — ZOLPIDEM TARTRATE 10 MG PO TABS: 10.0000 mg | ORAL_TABLET | Freq: Every evening | ORAL | Status: DC | PRN

## 2012-11-09 MED ORDER — ATENOLOL 100 MG PO TABS
100.0000 mg | ORAL_TABLET | Freq: Every day | ORAL | Status: DC
  Administered 2012-11-10: 100 mg via ORAL
  Filled 2012-11-09: qty 1

## 2012-11-09 MED ORDER — MIDAZOLAM HCL 10 MG/2ML IJ SOLN
INTRAMUSCULAR | Status: DC | PRN
  Administered 2012-11-09 (×5): 2.5 mg via INTRAVENOUS

## 2012-11-09 MED ORDER — BUDESONIDE-FORMOTEROL FUMARATE 80-4.5 MCG/ACT IN AERO
2.0000 | INHALATION_SPRAY | Freq: Two times a day (BID) | RESPIRATORY_TRACT | Status: DC
  Filled 2012-11-09: qty 6.9

## 2012-11-09 MED ORDER — FLUTICASONE PROPIONATE 50 MCG/ACT NA SUSP
2.0000 | Freq: Every day | NASAL | Status: DC
  Filled 2012-11-09: qty 16

## 2012-11-09 MED ORDER — SODIUM CHLORIDE 0.9 % IV SOLN
INTRAVENOUS | Status: DC
  Administered 2012-11-09: 09:00:00 via INTRAVENOUS
  Administered 2012-11-09: 500 mL via INTRAVENOUS

## 2012-11-09 MED ORDER — DOXYCYCLINE HYCLATE 50 MG PO CAPS: 50.0000 mg | ORAL_CAPSULE | Freq: Every day | ORAL | Status: DC

## 2012-11-09 MED ORDER — FENTANYL CITRATE 0.05 MG/ML IJ SOLN
INTRAMUSCULAR | Status: DC | PRN
  Administered 2012-11-09 (×5): 25 ug via INTRAVENOUS

## 2012-11-09 MED ORDER — DOXYCYCLINE HYCLATE 100 MG PO TABS
100.0000 mg | ORAL_TABLET | Freq: Every day | ORAL | Status: DC
  Administered 2012-11-10: 100 mg via ORAL
  Filled 2012-11-09 (×2): qty 1

## 2012-11-09 MED ORDER — SODIUM CHLORIDE 0.9 % IV BOLUS (SEPSIS)
500.0000 mL | Freq: Once | INTRAVENOUS | Status: AC
  Administered 2012-11-09: 500 mL via INTRAVENOUS

## 2012-11-09 MED ORDER — SODIUM CHLORIDE 0.9 % IV SOLN: INTRAVENOUS | Status: DC

## 2012-11-09 NOTE — Op Note (Signed)
[facility] [address] [place], [address]   FLEXIBLE SIGMOIDOSCOPY PROCEDURE REPORT  PATIENT: [name], [name]  MR#: [id number] BIRTHDATE: [birth year] , 56  yrs. old GENDER: Male ENDOSCOPIST: Rachael Fee, MD REFERRED BY: Stan Head MD PROCEDURE DATE:  11/09/2012 PROCEDURE:   Sigmoidoscopy with control of bleeding ASA CLASS:   Class III INDICATIONS:recent colonoscopy with removal of two polyps (rectum and sigmoid) with snare/cautery.  Ongoing plavix use.  Rectal bleeding started last night.[name] MEDICATIONS: Fentanyl 125 mcg IV and Versed 12 mg IV DESCRIPTION OF PROCEDURE:   After the risks benefits and alternatives of the procedure were thoroughly explained, informed consent was obtained.  revealed no abnormalities of the rectum. The [id number]  endoscope was introduced through the anus  and advanced to the descending colon , limited by No adverse events experienced. The quality of the prep was fair .  The instrument was then slowly withdrawn as the mucosa was fully examined.   COLON FINDINGS: There was still some solid stool in left colon that precluded perfect visualization of the mucosa.  There was a small amount of old blood clot in colon, no acitve or recent red blood. There were multiple medium sized sigmoid/descending diverticulum. None with active bleeding.  There was a 5mm ulcer with two slightly raised red spots in rectum.  This was one of the sites of recent polypectomy and was treated with placement of two endoclips. Despite multiple back and forth examinations I could not identify the second site of recent polypectomy.  There was medium sized internal hemorrhoids.  Retroflexed views revealed no abnormalities. The scope was then withdrawn from the patient and the procedure terminated. COMPLICATIONS: There were no complications.  ENDOSCOPIC IMPRESSION: Solid stool remaning in left colon.  No active or recent blood.  + left sided diverticulosis.  One of the sites of recent polypectomy was found, treated. The other site was not visible.[name]  RECOMMENDATIONS: Will observe overnight for signs of rebleeding.  If he does, then will perform more complete prep and repeat lower endoscopy.  He will hold plavix for now.    _______________________________ eSigned:  Rachael Fee, MD 11/09/2012 2:29 PM

## 2012-11-09 NOTE — ED Notes (Signed)
Per pt, had colonoscopy last Monday and had 2 polyps removed.  Resumed plavix the next day.  Pt states rectal bleeding bright red since 6pm last night of moderate amount.  Pt states no abdominal pain with bleeding.  Pt also has diverticulosis.  Pt stools are runny at this time.  Pt did have normal bm since procedure on Monday.

## 2012-11-09 NOTE — ED Provider Notes (Signed)
Pt admitted to observation per Dr Nathen May, MD 11/09/12 [phone]

## 2012-11-09 NOTE — H&P (Signed)
Primary Care Physician:  Hoyle Sauer, MD Primary Gastroenterologist:  Stan Head, M.D. CHIEF COMPLAINT:  Rectal bleeding  HPI: [name] is a 56 y.o. male with a history of multiple myeloma, status post stem cell transplant. He also has a history of coronary artery disease, GERD, adenomatous colon polyps and diverticulitis. Patient had surveillance colonoscopy with polypectomy 11/01/12. Findings included severe left-sided diverticulosis and 2 polyps in the sigmoid and rectum. Polypectomy was performed with snare cautery. Patient restarted Plavix the following day. Yesterday evening patient began having painless hematochezia. Bleeding continued, he presented to the emergency department for evaluation. Hemoglobin 12.9, baseline hgb 13-14 range.    Past Medical History  Diagnosis Date  . multiple myeloma dx'd 2005    chemo/xrt comp 2006  . Plasmocytoma dx'd 2007    IVIG treatment in progress  . CAD (coronary artery disease)   . Chronic renal insufficiency   . HTN (hypertension)   . Hypercholesteremia   . Diverticulitis   . Gout   . GERD (gastroesophageal reflux disease)   . Hx of stem cell transplant 02/2006  . Peripheral neuropathy     chronic  . B12 deficiency   . MI (myocardial infarction)   . Rosacea   . Recurrent infections     after stem cell transplant - on Iv IgG  . Personal history of colonic polyps 11/01/2012    2006 - 6 adenomas max 6 mm (Medoff) 2009 - inflammatory polyps x 2 11/01/2012 sigmoid 10 mm and rectal 8 mm polyps - retention polyps      Past Surgical History  Procedure Date  . Coronary artery bypass graft 08/19/1993    [facility] in [place], [place]  . Colonoscopy w/ polypectomy   . Lasik 10/2009    Dr. Nile Riggs  . Total hip arthroplasty 02/03/2005    right  . Stent placement for hydronephrosis 11/2004    Dr. Retta Diones  . Hernia repair 09/2010    left, Dr. Dwain Sarna  . Coronary angioplasty with stent placement 05/2011    Dr. Reuel Boom in [facility]    Prior to Admission medications   Medication Sig Start Date End Date Taking? Authorizing Provider  amLODipine (NORVASC) 10 MG tablet Take 10 mg by mouth daily.   Yes Historical Provider, MD  aspirin 81 MG tablet Take 81 mg by mouth daily.   Yes Historical Provider, MD  atenolol (TENORMIN) 100 MG tablet Take 100 mg by mouth daily.   Yes Historical Provider, MD  atorvastatin (LIPITOR) 80 MG tablet Take 80 mg by mouth daily.   Yes Historical Provider, MD  budesonide-formoterol (SYMBICORT) 80-4.5 MCG/ACT inhaler Inhale 2 puffs into the lungs 2 (two) times daily.   Yes Historical Provider, MD  clopidogrel (PLAVIX) 75 MG tablet Take 75 mg by mouth daily.   Yes Historical Provider, MD  doxycycline (VIBRAMYCIN) 50 MG capsule Take 50 mg by mouth daily.   Yes Historical Provider, MD  ezetimibe-simvastatin (VYTORIN) 10-10 MG per tablet Take 1 tablet by mouth at bedtime.   Yes Historical Provider, MD  fenofibrate (LOFIBRA) 160 MG tablet Take 160 mg by mouth daily.   Yes Historical Provider, MD  fluticasone (FLONASE) 50 MCG/ACT nasal spray Place 2 sprays into the nose daily.   Yes Historical Provider, MD  loratadine (CLARITIN) 10 MG tablet Take 10 mg by mouth daily.   Yes Historical Provider, MD  NON FORMULARY IV Immunoglobulin quarterly   Yes Historical Provider, MD  omeprazole (PRILOSEC) 20 MG capsule Take 20 mg by  mouth daily.   Yes Historical Provider, MD  zolpidem (AMBIEN) 10 MG tablet Take 10 mg by mouth at bedtime as needed.   Yes Historical Provider, MD    Current Facility-Administered Medications  Medication Dose Route Frequency Provider Last Rate Last Dose  . 0.9 %  sodium chloride infusion   Intravenous Continuous Iva Boop, MD 100 mL/hr at 11/09/12 0830    . [COMPLETED] sodium chloride 0.9 % bolus 500 mL  500 mL Intravenous Once Iva Boop, MD 250 mL/hr at 11/09/12 0734 500 mL at 11/09/12 0734  . [DISCONTINUED] budesonide-formoterol (SYMBICORT) 80-4.5 MCG/ACT inhaler 2 puff   2 puff Inhalation BID Iva Boop, MD       Facility-Administered Medications Ordered in Other Encounters  Medication Dose Route Frequency Provider Last Rate Last Dose  . [COMPLETED] 0.9 %  sodium chloride infusion   Intravenous Once Pierce Crane, MD      . [address] Immune Globulin  10% (PRIVIGEN) SOLN 80 g  80 g Intravenous Once Pierce Crane, MD   80 g at 11/08/12 1040  . [DISCONTINUED] acetaminophen (TYLENOL) tablet 650 mg  650 mg Oral Q6H PRN Pierce Crane, MD   650 mg at 11/08/12 0803    Allergies as of 11/09/2012  . (No Known Allergies)    Family History  Problem Relation Age of Onset  . Heart attack Father 58    x 2  . Colon cancer Mother   . Colon polyps Mother     History   Social History  . Marital Status: Married    Spouse Name: N/A    Number of Children: 1  . Years of Education: N/A   Occupational History  . disabled    Social History Main Topics  . Smoking status: Former Smoker    Quit date: 12/22/1992  . Smokeless tobacco: Never Used  . Alcohol Use: 0.0 oz/week     Comment: 1 glass of wine/ day  . Drug Use: No  . Sexually Active: No    Social History Narrative   Patient is married with one sonOn disabilityDoes use alcohol, former smoker, 2 caffeinated beverages dailyUpdated 09/28/2012    Review of Systems: All systems reviewed and negative except where noted in the history of present illness  Physical Exam: Vital signs in last 24 hours: Temp:  [97.3 F (36.3 C)-98.2 F (36.8 C)] 98.2 F (36.8 C) (11/19 1003) Pulse Rate:  [50-74] 56  (11/19 1003) Resp:  [18-20] 20  (11/19 1003) BP: (130-164)/(81-94) 164/93 mmHg (11/19 1003) SpO2:  [97 %-100 %] 97 % (11/19 1003) Weight:  [170 lb 3.1 oz (77.2 kg)] 170 lb 3.1 oz (77.2 kg) (11/19 1003)   General:   Pleasant white male in NAD Head:  Normocephalic and atraumatic. Eyes:  Sclera clear, no icterus.   Conjunctiva pink. Ears:  Normal auditory acuity. Lungs:  Clear throughout to auscultation.   No  wheezes, crackles, or rhonchi. No acute distress. Heart:  Regular rate and rhythm Abdomen:  Soft, nontender and nondistended. No masses, hepatomegaly. No obvious masses.  Normal bowel .    Rectal: no done, just got enema for bowel prep Msk:  Symmetrical without gross deformities. Normal posture. Pulses:  Normal pulses noted. Extremities:  Without edema. Neurologic:  Alert and  oriented x4;  grossly normal neurologically. Skin:  Intact without significant lesions or rashes. Cervical Nodes:  No significant cervical adenopathy. Psych:  Alert and cooperative. Normal mood and affect.  Lab Results:  [facility] 11/09/12 0440 11/08/12  0746  WBC 3.3* 4.4  HGB 12.9* 13.5  HCT 38.5* 40.5  PLT 170 166   BMET  Basename 11/09/12 0440 11/08/12 0746  NA 135 139  K 3.8 4.1  CL 101 106  CO2 24 28  GLUCOSE 97 87  BUN 21 20.0  CREATININE 1.40* 1.4*  CALCIUM 9.1 9.9   LFT  Basename 11/08/12 0746  PROT 10.1*  ALBUMIN 3.8  AST 17  ALT 17  ALKPHOS 27*  BILITOT 0.84  BILIDIR --  IBILI --   PT/INR  Basename 11/09/12 0440  LABPROT 12.5  INR 0.94   Impression / Plan:   1. painless hematochezia, suspect post-polypectomy bleed in the setting of Plavix. Plavix is on hold, hemoglobin currently okay at 12.9. Plan is for flexible sigmoidoscopy today for further evaluation and treatment (possibly Endo Clip placement). Monitor hemoglobin, hopefully he will not require blood transfusion  2. renal insufficiency, creatinine 1.4, actually better than his baseline of 1.5  3. coronary artery disease, on chronic Plavix. Plavix currently on hold secondary to #1.  4. multiple myeloma, status post stem cell transplant. He gets IV immunoglobulin infusions.   LOS: 0 days   Willette Cluster  11/09/2012, 10:32 AM    [facility] Attending  I have also seen and assessed the patient and agree with the above note. Iva Boop, MD, [facility] [phone] (pager) 11/09/2012 12:35  PM

## 2012-11-09 NOTE — Interval H&P Note (Signed)
History and Physical Interval Note:  11/09/2012 1:05 PM  [name]  has presented today for surgery, with the diagnosis of GI bleed after colon polypectomy  The various methods of treatment have been discussed with the patient and family. After consideration of risks, benefits and other options for treatment, the patient has consented to  Procedure(s) (LRB) with comments: FLEXIBLE SIGMOIDOSCOPY (N/A) as a surgical intervention .  The patient's history has been reviewed, patient examined, no change in status, stable for surgery.  I have reviewed the patient's chart and labs.  Questions were answered to the patient's satisfaction.     Rob Bunting

## 2012-11-09 NOTE — ED Notes (Signed)
Pt transferred to 1511 accompanied by this nurse with personal belongings, condition stable at this time.

## 2012-11-09 NOTE — ED Provider Notes (Signed)
History     CSN: [id number]  Arrival date & time 11/09/12  4098   First MD Initiated Contact with Patient 11/09/12 [phone]      Chief Complaint  Patient presents with  . Rectal Bleeding    (Consider location/radiation/quality/duration/timing/severity/associated sxs/prior treatment) HPI 56 year old male presents to emergency room complaining of bright red blood per time. Patient had colonoscopy done one week ago, had to polyps removed. Patient restarted on his Plavix the next day. Patient reports around 6 PM he has had 4-5 episodes of crampy abdominal pain followed by bright red blood. Patient reports he woke just prior to arrival with blood in the bed. He thinks he may have passed some gas just prior to waking. No pain aside from when he has the crampy abdominal pain. Patient with history of coronary disease status post stent, multiple myeloma status post transfusion, plasma cytoma with IVIG yesterday. She denies any shortness of breath, chest pain, fevers. He has had a small amount of dizziness. He is on aspirin and Plavix, but no Coumadin or other blood thinners.  Past Medical History  Diagnosis Date  . multiple myeloma dx'd 2005    chemo/xrt comp 2006  . Plasmocytoma dx'd 2007    IVIG treatment in progress  . CAD (coronary artery disease)   . Chronic renal insufficiency   . HTN (hypertension)   . Hypercholesteremia   . Diverticulitis   . Gout   . GERD (gastroesophageal reflux disease)   . Hx of stem cell transplant 02/2006  . Peripheral neuropathy     chronic  . B12 deficiency   . MI (myocardial infarction)   . Rosacea   . Recurrent infections     after stem cell transplant - on Iv IgG  . Personal history of colonic polyps 11/01/2012    2006 - 6 adenomas max 6 mm (Medoff) 2009 - inflammatory polyps x 2 11/01/2012 sigmoid 10 mm and rectal 8 mm polyps - retention polyps      Past Surgical History  Procedure Date  . Coronary artery bypass graft 08/19/1993    [facility] in [place], [place]  . Colonoscopy w/ polypectomy   . Lasik 10/2009    Dr. Nile Riggs  . Total hip arthroplasty 02/03/2005    right  . Stent placement for hydronephrosis 11/2004    Dr. Retta Diones  . Hernia repair 09/2010    left, Dr. Dwain Sarna  . Coronary angioplasty with stent placement 05/2011    Dr. Reuel Boom in [facility]    Family History  Problem Relation Age of Onset  . Heart attack Father 58    x 2  . Colon cancer Mother   . Colon polyps Mother     History  Substance Use Topics  . Smoking status: Former Smoker    Quit date: 12/22/1992  . Smokeless tobacco: Never Used  . Alcohol Use: 0.0 oz/week     Comment: 1 glass of wine/ day      Review of Systems   See History of Present Illness; otherwise all other systems are reviewed and negative  Allergies  Review of patient's allergies indicates no known allergies.  Home Medications   Current Outpatient Rx  Name  Route  Sig  Dispense  Refill  . AMLODIPINE BESYLATE 10 MG PO TABS   Oral   Take 10 mg by mouth daily.         . ASPIRIN 81 MG PO TABS   Oral   Take 81 mg by mouth  daily.         . ATENOLOL 100 MG PO TABS   Oral   Take 100 mg by mouth daily.         . ATORVASTATIN CALCIUM 80 MG PO TABS   Oral   Take 80 mg by mouth daily.         . BUDESONIDE-FORMOTEROL FUMARATE 80-4.5 MCG/ACT IN AERO   Inhalation   Inhale 2 puffs into the lungs 2 (two) times daily.         [name] CLOPIDOGREL BISULFATE 75 MG PO TABS   Oral   Take 75 mg by mouth daily.         [name] DOXYCYCLINE HYCLATE 50 MG PO CAPS   Oral   Take 50 mg by mouth daily.         [name] EZETIMIBE-SIMVASTATIN 10-10 MG PO TABS   Oral   Take 1 tablet by mouth at bedtime.         . FENOFIBRATE 160 MG PO TABS   Oral   Take 160 mg by mouth daily.         [name] FLUTICASONE PROPIONATE 50 MCG/ACT NA SUSP   Nasal   Place 2 sprays into the nose daily.         [name] LORATADINE 10 MG PO TABS   Oral   Take 10 mg by mouth daily.         . NON  FORMULARY      IV Immunoglobulin quarterly         . OMEPRAZOLE 20 MG PO CPDR   Oral   Take 20 mg by mouth daily.         [name] ZOLPIDEM TARTRATE 10 MG PO TABS   Oral   Take 10 mg by mouth at bedtime as needed.           BP 130/94  Pulse 64  Temp 97.3 F (36.3 C) (Oral)  Resp 18  SpO2 98%  Physical Exam  Nursing note and vitals reviewed. Constitutional: He is oriented to person, place, and time. He appears well-developed and well-nourished. No distress.  HENT:  Head: Normocephalic and atraumatic.  Nose: Nose normal.  Mouth/Throat: Oropharynx is clear and moist.  Eyes: Conjunctivae normal and EOM are normal. Pupils are equal, round, and reactive to light.  Neck: Normal range of motion. Neck supple. No JVD present. No tracheal deviation present. No thyromegaly present.  Cardiovascular: Normal rate, regular rhythm, normal heart sounds and intact distal pulses.  Exam reveals no gallop and no friction rub.   No murmur heard. Pulmonary/Chest: Effort normal and breath sounds normal. No stridor. No respiratory distress. He has no wheezes. He has no rales. He exhibits no tenderness.  Abdominal: Soft. Bowel sounds are normal. He exhibits no distension and no mass. There is no tenderness. There is no rebound and no guarding.  Genitourinary: Guaiac positive stool.       No hemorrhoids seen or palpated. Small amount of bright red blood on glove after rectal exam  Musculoskeletal: Normal range of motion. He exhibits no edema and no tenderness.  Lymphadenopathy:    He has no cervical adenopathy.  Neurological: He is alert and oriented to person, place, and time. He exhibits normal muscle tone. Coordination normal.  Skin: Skin is warm and dry. No rash noted. He is not diaphoretic. No erythema. No pallor.  Psychiatric: He has a normal mood and affect. His behavior is normal. Judgment and thought content normal.  ED Course  Procedures (including critical care time)  Labs Reviewed    CBC WITH DIFFERENTIAL - Abnormal; Notable for the following:    WBC 3.3 (*)     Hemoglobin 12.9 (*)     HCT 38.5 (*)     Neutrophils Relative 42 (*)     Neutro Abs 1.4 (*)     Monocytes Relative 17 (*)     All other components within normal limits  BASIC METABOLIC PANEL - Abnormal; Notable for the following:    Creatinine, Ser 1.40 (*)     GFR calc non Af Amer 54 (*)     GFR calc Af Amer 63 (*)     All other components within normal limits  PROTIME-INR  OCCULT BLOOD, POC DEVICE   No results found.   1. BRBPR (bright red blood per rectum)       MDM   56 year old male with bright red blood per rectum after recent colonoscopy and polyp removal. Hemoglobin essentially stable, 1 point drop from yesterday not tachycardic. Will discuss with gastroenterology for close followup in the office versus admission  6:24 AM Discuss case with Dr. Leone Payor, patient's gastroenterologist. He will see him in the emergency department to determine disposition.        Olivia Mackie, MD 11/09/12 [phone]

## 2012-11-09 NOTE — ED Notes (Signed)
Called to give report to floor, nurse in meeting told to call back at [phone].

## 2012-11-09 NOTE — ED Notes (Signed)
Called to give report again, nurse not back yet. Gave name and call back number for nurse to call when back.

## 2012-11-10 ENCOUNTER — Encounter (HOSPITAL_COMMUNITY): Payer: Self-pay | Admitting: Gastroenterology

## 2012-11-10 ENCOUNTER — Encounter (HOSPITAL_COMMUNITY): Payer: Self-pay

## 2012-11-10 LAB — BASIC METABOLIC PANEL
CO2: 27 mEq/L (ref 19–32)
Calcium: 8.4 mg/dL (ref 8.4–10.5)
GFR calc Af Amer: 69 mL/min — ABNORMAL LOW (ref >90)
GFR calc non Af Amer: 60 mL/min — ABNORMAL LOW (ref >90)
Sodium: 141 mEq/L (ref 135–145)

## 2012-11-10 LAB — CBC
MCH: 29.3 pg (ref 26.0–34.0)
Platelets: 121 10*3/uL — ABNORMAL LOW (ref 150–400)
RBC: 3.96 MIL/uL — ABNORMAL LOW (ref 4.22–5.81)
RDW: 13.6 % (ref 11.5–15.5)

## 2012-11-10 NOTE — Progress Notes (Signed)
[facility] Progress Note  SUBJECTIVE: feels fine, no further bleeding.   OBJECTIVE:  Vital signs in last 24 hours: Temp:  [97.4 F (36.3 C)-98.2 F (36.8 C)] 97.7 F (36.5 C) (11/20 0600) Pulse Rate:  [50-64] 64  (11/20 0600) Resp:  [8-22] 18  (11/20 0600) BP: (98-175)/(53-103) 171/90 mmHg (11/20 0600) SpO2:  [85 %-100 %] 96 % (11/20 0600) Weight:  [170 lb 3.1 oz (77.2 kg)] 170 lb 3.1 oz (77.2 kg) (11/19 1003) Last BM Date: 11/09/12 General:    Pleasant white male in NAD Heart:  Regular rate and rhythm; no murmurs Lungs: Respirations even and unlabored, lungs CTA bilaterally Abdomen:  Soft, nontender and nondistended. Normal bowel sounds. Extremities:  Without edema. Neurologic:  Alert and oriented,  grossly normal neurologically. Psych:  Cooperative. Normal mood and affect.  I Lab Results:  Basename 11/10/12 0513 11/09/12 1108 11/09/12 0440  WBC 2.0* 2.3* 3.3*  HGB 11.6* 12.3*12.2* 12.9*  HCT 34.9* 35.9* 38.5*  PLT 121* 133* 170   BMET  Basename 11/10/12 0513 11/09/12 0440 11/08/12 0746  NA 141 135 139  K 4.3 3.8 4.1  CL 108 101 106  CO2 27 24 28   GLUCOSE 92 97 87  BUN 14 21 20.0  CREATININE 1.29 1.40* 1.4*  CALCIUM 8.4 9.1 9.9   LFT  Basename 11/08/12 0746  PROT 10.1*  ALBUMIN 3.8  AST 17  ALT 17  ALKPHOS 27*  BILITOT 0.84  BILIDIR --  IBILI --   PT/INR  Basename 11/09/12 0440  LABPROT 12.5  INR 0.94    ASSESSMENT / PLAN:  1. Post-polypectomy bleed in the setting of Plavix, s/p flex sig with endoclip placement at polypectomy site in rectum. Hemoglobin down slightly overnight but still okay at 11.6. No bleeding / BMs since procedure yesterday. He can probably go home today. Plavix is on hold,  2. renal insufficiency, resolving.    3. coronary artery disease, on chronic Plavix. Plavix currently on hold secondary to #1.   4. multiple myeloma, status post stem cell transplant. He gets IV immunoglobulin infusions    LOS: 1 day    Willette Cluster  11/10/2012, 9:27 AM   ________________________________________________________________________  [facility] MD note:  I personally examined the patient, reviewed the data and agree with the assessment and plan described above.  Ok to go home. Will hold plavix until Monday, he can resume ASA today.   Rob Bunting, MD [facility] Pager [phone]

## 2012-11-10 NOTE — Discharge Summary (Signed)
[facility] Discharge Summary  Name: [name] MRN: [id number] DOB: 1956/10/16 56 y.o. PCP:  Hoyle Sauer, MD  Date of Admission: 11/09/2012  4:21 AM Date of Discharge: 11/10/2012 Attending Physician: Rob Bunting, MD Primary Gastroenterologist: Stan Head, MD Discharging Physician: Rob Bunting, MD  Discharge Diagnosis: 1. Post-polypectomy bleed, resolved 2. anemia of acute blood loss, mild. 3. coronary artery disease, on chronic Plavix. Plavix will be held  until Monday 11/15/12 (5 more days).  Consultations: none   GI Procedures: Flexible sigmoidoscopy with Endo Clip placement at polypectomy site in rectum.  History/Physical Exam:  See Admission H&P  Admission HPI: [name] is a 56 y.o. male with a history of multiple myeloma, status post stem cell transplant. He also has a history of coronary artery disease, GERD, adenomatous colon polyps and diverticulitis. Patient had surveillance colonoscopy with polypectomy 11/01/12. Findings included severe left-sided diverticulosis and 2 polyps in the sigmoid and rectum. Polypectomy was performed with snare cautery. Patient restarted Plavix the following day. Yesterday evening patient began having painless hematochezia. Bleeding continued, he presented to the emergency department for evaluation. Hemoglobin 12.9, baseline hgb 13-14 range.    Hospital Course by problem list:  1. Post-polypectomy bleed in setting of Plavix.. Patient was admitted from the emergency department to a medical bed at [facility].   On the day of admission he underwent a flexible sigmoidoscopy for evaluation of painless hematochezia. Findings included solid stool in the left colon precluding great visualization of the mucosa. Some old blood was seen in the lumen but no active bleeding. There were multiple sigmoid/descending diverticulum and medium sized internal hemorrhoids. A 5mm ulcer with some raised red spots was seen in the  rectum, this was the site of a recent polypectomy. Two endoclips were placed at this site. Of note, patient had 2 polypectomies done at the time of his colonoscopy but only one site could be identified at time of flexible sigmoidoscopy .  Patient had no further bleeding post procedure. The following day he was stable for discharge. Plan was to hold Plavix until 11/15/12.  2. mild anemia of acute blood loss. Nadir Hemoglobin 11.6. His baseline is around 13.5.  3. coronary artery disease , on chronic Plavix. Plavix will be resumed 11/15/12 .  4. Other medical problems (as listed in PMH) remained stable this admission   Vitals:  BP 171/90  Pulse 64  Temp 97.7 F (36.5 C) (Oral)  Resp 18  Ht 5\' 9"  (1.753 m)  Wt 170 lb 3.1 oz (77.2 kg)  BMI 25.13 kg/m2  SpO2 96%  Discharge Labs:  Results for orders placed during the hospital encounter of 11/09/12 (from the past 24 hour(s))  HEMOGLOBIN     Status: Abnormal   Collection Time   11/09/12 11:08 AM      Component Value Range   Hemoglobin 12.3 (*) 13.0 - 17.0 g/dL  CBC     Status: Abnormal   Collection Time   11/09/12 11:08 AM      Component Value Range   WBC 2.3 (*) 4.0 - 10.5 K/uL   RBC 4.08 (*) 4.22 - 5.81 MIL/uL   Hemoglobin 12.2 (*) 13.0 - 17.0 g/dL   HCT 40.9 (*) 81.1 - 91.4 %   MCV 88.0  78.0 - 100.0 fL   MCH 29.9  26.0 - 34.0 pg   MCHC 34.0  30.0 - 36.0 g/dL   RDW 78.2  95.6 - 21.3 %   Platelets 133 (*) 150 - 400 K/uL  CBC     Status: Abnormal   Collection Time   11/10/12  5:13 AM      Component Value Range   WBC 2.0 (*) 4.0 - 10.5 K/uL   RBC 3.96 (*) 4.22 - 5.81 MIL/uL   Hemoglobin 11.6 (*) 13.0 - 17.0 g/dL   HCT 45.4 (*) 09.8 - 11.9 %   MCV 88.1  78.0 - 100.0 fL   MCH 29.3  26.0 - 34.0 pg   MCHC 33.2  30.0 - 36.0 g/dL   RDW 14.7  82.9 - 56.2 %   Platelets 121 (*) 150 - 400 K/uL  BASIC METABOLIC PANEL     Status: Abnormal   Collection Time   11/10/12  5:13 AM      Component Value Range   Sodium 141  135 - 145  mEq/L   Potassium 4.3  3.5 - 5.1 mEq/L   Chloride 108  96 - 112 mEq/L   CO2 27  19 - 32 mEq/L   Glucose, Bld 92  70 - 99 mg/dL   BUN 14  6 - 23 mg/dL   Creatinine, Ser 1.30  0.50 - 1.35 mg/dL   Calcium 8.4  8.4 - 86.5 mg/dL   GFR calc non Af Amer 60 (*) >90 mL/min   GFR calc Af Amer 69 (*) >90 mL/min    Disposition and follow-up:   Mr.Skip Gengler was discharged from [facility] in stable condition.    Follow-up Appointments: Discharge Orders    Future Appointments: Provider: Department: Dept Phone: Center:   12/07/2012 4:00 PM Pierce Crane, MD [facility] MED[facility]COL[facility] [phone] None      Discharge Medications:   Medication List     As of 11/10/2012 10:03 AM    ASK your doctor about these medications         amLODipine 10 MG tablet   Commonly known as: NORVASC   Take 10 mg by mouth daily.      aspirin 81 MG tablet   Take 81 mg by mouth daily.      atenolol 100 MG tablet   Commonly known as: TENORMIN   Take 100 mg by mouth daily.      atorvastatin 80 MG tablet   Commonly known as: LIPITOR   Take 80 mg by mouth daily.      budesonide-formoterol 80-4.5 MCG/ACT inhaler   Commonly known as: SYMBICORT   Inhale 2 puffs into the lungs 2 (two) times daily.      clopidogrel 75 MG tablet   Commonly known as: PLAVIX   Take 75 mg by mouth daily.      doxycycline 50 MG capsule   Commonly known as: VIBRAMYCIN   Take 50 mg by mouth daily.      ezetimibe-simvastatin 10-10 MG per tablet   Commonly known as: VYTORIN   Take 1 tablet by mouth at bedtime.      fluticasone 50 MCG/ACT nasal spray   Commonly known as: FLONASE   Place 2 sprays into the nose daily.      LOFIBRA 160 MG tablet   Generic drug: fenofibrate   Take 160 mg by mouth daily.      loratadine 10 MG tablet   Commonly known as: CLARITIN   Take 10 mg by mouth daily.      NON FORMULARY   IV Immunoglobulin quarterly      omeprazole 20 MG capsule   Commonly known as:  PRILOSEC   Take 20  mg by mouth daily.      zolpidem 10 MG tablet   Commonly known as: AMBIEN   Take 10 mg by mouth at bedtime as needed.        Signed: Willette Cluster 11/10/2012, 10:03 AM

## 2012-11-10 NOTE — Progress Notes (Signed)
Discharge information reviewed with pt. Pt verbalized understanding of all information and the importance of holding the Plavix until 11/25. Pt understands the f/u with Dr. Leone Payor. Pt verbalized medication regimen. IV discontinued and pt discharged to private vehicle via W/C

## 2012-11-11 LAB — KAPPA/LAMBDA LIGHT CHAINS
Kappa free light chain: 2.26 mg/dL — ABNORMAL HIGH (ref 0.33–1.94)
Kappa:Lambda Ratio: 0.87 (ref 0.26–1.65)
Lambda Free Lght Chn: 2.61 mg/dL (ref 0.57–2.63)

## 2012-11-11 LAB — SPEP & IFE WITH QIG
Albumin ELP: 44.2 % — ABNORMAL LOW (ref 55.8–66.1)
Alpha-1-Globulin: 2.8 % — ABNORMAL LOW (ref 2.9–4.9)
Alpha-2-Globulin: 5.8 % — ABNORMAL LOW (ref 7.1–11.8)
Beta Globulin: 6.4 % (ref 4.7–7.2)
Total Protein, Serum Electrophoresis: 10.2 g/dL — ABNORMAL HIGH (ref 6.0–8.3)

## 2012-11-11 LAB — BETA 2 MICROGLOBULIN, SERUM: Beta-2 Microglobulin: 2.48 mg/L — ABNORMAL HIGH (ref 1.01–1.73)

## 2012-11-15 ENCOUNTER — Telehealth: Payer: Self-pay | Admitting: Internal Medicine

## 2012-11-15 NOTE — Telephone Encounter (Signed)
No more bleeding Started Plavix today He knows to call if there are recurrent problems.

## 2012-11-16 ENCOUNTER — Telehealth: Payer: Self-pay

## 2012-11-16 NOTE — Telephone Encounter (Signed)
Left message for patient to call back  

## 2012-11-16 NOTE — Telephone Encounter (Signed)
Message copied by Annett Fabian on Tue Nov 16, 2012  1:59 PM ------      Message from: Stan Head E      Created: Wed Nov 10, 2012  3:26 PM       Please call him for an update on Monday 11/25 - had post polypectomy bleeding                  ----- Message -----         From: Meredith Pel, NP         Sent: 11/10/2012  10:41 AM           To: Iva Boop, MD            Remember to call and check on Mr. Fandino,              Also, we got an "indeterminate" on Sheffield's quantiferon.Went ahead with remicade but not sure how to interpret that result.            PG

## 2012-11-23 NOTE — Telephone Encounter (Signed)
Patient states no further bleeding or concerns.  He thanked me for the call.  His number is [phone]

## 2012-11-23 NOTE — Telephone Encounter (Signed)
Left message for patient to call back  

## 2012-12-07 ENCOUNTER — Ambulatory Visit (HOSPITAL_BASED_OUTPATIENT_CLINIC_OR_DEPARTMENT_OTHER): Payer: PRIVATE HEALTH INSURANCE | Admitting: Oncology

## 2012-12-07 VITALS — BP 134/84 | HR 59 | Temp 98.2°F | Resp 20 | Ht 69.0 in | Wt 173.8 lb

## 2012-12-07 DIAGNOSIS — C9001 Multiple myeloma in remission: Secondary | ICD-10-CM

## 2012-12-07 DIAGNOSIS — C9000 Multiple myeloma not having achieved remission: Secondary | ICD-10-CM

## 2012-12-07 NOTE — Progress Notes (Signed)
ID: [name]  DOB: [birth year]  MR#: [id number]  CSN#: [id number]   1. Interval History:  Multiple myeloma status post stem cell transplant March 2007 secondary to recurrent plasmacytomas. 2. History of recurrent sinopulmonary infections.   3. History of prophylactic use of IVIG.  , Currently being done every 3 months 0.5 g /kg  4.     history of myocardial infarction June 2012 status post stent placement  ROS:  Who returns for followup. He is doing well he's had no intercurrent illnesses or other problems. He continues on Plavix and aspirin for his cardiac issues. His medications are otherwise the same. He received IVIG November. Unfortunately his blood work was done after this was infused. I suspect some of the results are reflection of that.  No Known Allergies  Current Outpatient Prescriptions  Medication Sig Dispense Refill  . amLODipine (NORVASC) 10 MG tablet Take 10 mg by mouth daily.      [name] aspirin 81 MG tablet Take 81 mg by mouth daily.      [name] atenolol (TENORMIN) 100 MG tablet Take 100 mg by mouth daily.      [name] atorvastatin (LIPITOR) 80 MG tablet Take 80 mg by mouth daily.      . budesonide-formoterol (SYMBICORT) 80-4.5 MCG/ACT inhaler Inhale 2 puffs into the lungs 2 (two) times daily.      . clopidogrel (PLAVIX) 75 MG tablet Take 1 tablet (75 mg total) by mouth daily.  30 tablet  0  . fenofibrate (LOFIBRA) 160 MG tablet Take 160 mg by mouth daily.      . fluticasone (FLONASE) 50 MCG/ACT nasal spray Place 2 sprays into the nose daily.      [name] loratadine (CLARITIN) 10 MG tablet Take 10 mg by mouth daily.      [name] omeprazole (PRILOSEC) 20 MG capsule Take 20 mg by mouth daily.      [name] zolpidem (AMBIEN) 10 MG tablet Take 10 mg by mouth at bedtime as needed.      . doxycycline (VIBRAMYCIN) 50 MG capsule Take 50 mg by mouth daily.      [name] ezetimibe-simvastatin (VYTORIN) 10-10 MG per tablet Take 1 tablet by mouth at bedtime.      . NON FORMULARY IV Immunoglobulin quarterly          Objective: Pleasant alert man looking stated age in no acute distress Filed Vitals:   12/07/12 1605  BP: 134/84  Pulse: 59  Temp: 98.2 F (36.8 C)  Resp: 20    BMI: Body mass index is 25.67 kg/(m^2).   ECOG FS:  Physical Exam:   Sclerae unicteric  Oropharynx clear  No peripheral adenopathy  Lungs clear -- no rales or rhonchi  Heart regular rate and rhythm  Abdomen benign  MSK no focal spinal tenderness, no peripheral edema  Neuro nonfocal  Lab Results:      Chemistry      Component Value Date/Time   NA 141 11/10/2012 0513   NA 139 11/08/2012 0746   K 4.3 11/10/2012 0513   K 4.1 11/08/2012 0746   CL 108 11/10/2012 0513   CL 106 11/08/2012 0746   CO2 27 11/10/2012 0513   CO2 28 11/08/2012 0746   BUN 14 11/10/2012 0513   BUN 20.0 11/08/2012 0746   CREATININE 1.29 11/10/2012 0513   CREATININE 1.4* 11/08/2012 0746      Component Value Date/Time   CALCIUM 8.4 11/10/2012 0513   CALCIUM 9.9 11/08/2012 0746   ALKPHOS 27* 11/08/2012 0746  ALKPHOS 27* 08/09/2012 0757   AST 17 11/08/2012 0746   AST 15 08/09/2012 0757   ALT 17 11/08/2012 0746   ALT 12 08/09/2012 0757   BILITOT 0.84 11/08/2012 0746   BILITOT 0.4 08/09/2012 0757       Lab Results  Component Value Date   WBC 2.0* 11/10/2012   HGB 11.6* 11/10/2012   HCT 34.9* 11/10/2012   MCV 88.1 11/10/2012   PLT 121* 11/10/2012   NEUTROABS 1.4* 11/09/2012    Studies/Results:    Assessment: History of recurrent plasmacytomas status post stem cell transplant now 6 years out in stable condition and in apparent remission based on recent labs. He has not had recurrent sinus and infections and will continue IVIG every 3 months.   Plan  Berthold returns 3 months time. I did review his lab work. He did have a total elevated IgG which I think was a function of his receiving IVIG prior to blood work. In addition cause cancer sentinel node which I suspect is dilutional. I will bring him back in about a week and a half  to recheck a CBC. If all is well we'll continue to see him every 3 months. He has been possibly reducing his IVIG every 6 months. Given intrinsic half-life of IVIG and my inclination to continues every 3 months.  Wade Sigala 12/07/2012

## 2012-12-08 ENCOUNTER — Telehealth: Payer: Self-pay | Admitting: *Deleted

## 2012-12-08 NOTE — Telephone Encounter (Signed)
Former patient of dr.rubin re-establishing patient with dr.granfortuna per the orders from 12-08-2012  [facility] email to set up patient's schedule IVIG

## 2012-12-17 ENCOUNTER — Telehealth: Payer: Self-pay | Admitting: Oncology

## 2012-12-17 NOTE — Telephone Encounter (Signed)
S/w pt re re-establishing w/JG and gv pt lb/JG for 3/17. Also confirmed appts for lb 12/30 and lb/ivig 2/3. Per 12/17 pof pt should have ivig in February and see JG 78month.

## 2012-12-20 ENCOUNTER — Other Ambulatory Visit (HOSPITAL_BASED_OUTPATIENT_CLINIC_OR_DEPARTMENT_OTHER): Payer: PRIVATE HEALTH INSURANCE | Admitting: Lab

## 2012-12-20 DIAGNOSIS — C9001 Multiple myeloma in remission: Secondary | ICD-10-CM

## 2012-12-20 LAB — CBC & DIFF AND RETIC
Basophils Absolute: 0 10*3/uL (ref 0.0–0.1)
Eosinophils Absolute: 0 10*3/uL (ref 0.0–0.5)
HGB: 12.7 g/dL — ABNORMAL LOW (ref 13.0–17.1)
LYMPH%: 27.7 % (ref 14.0–49.0)
MCV: 89.6 fL (ref 79.3–98.0)
MONO%: 10.3 % (ref 0.0–14.0)
NEUT#: 4.1 10*3/uL (ref 1.5–6.5)
NEUT%: 61.3 % (ref 39.0–75.0)
Platelets: 227 10*3/uL (ref 140–400)
RBC: 4.23 10*6/uL (ref 4.20–5.82)

## 2013-01-20 NOTE — Progress Notes (Signed)
ID: [name]  DOB: 11-30-1956  MR#: [id number]  CSN#: [id number]   1. Interval History:  Multiple myeloma status post stem cell transplant March 2007 secondary to recurrent plasmacytomas. 2. History of recurrent sinopulmonary infections.   3. History of prophylactic use of IVIG.      ROS: [name] returns for followup. He is been doing well. He has no new complaints, appetite is good and weight is stable. He is tolerating q 3 mo IVIG. No recent sinopulmonary infections.   No Known Allergies  Current Outpatient Prescriptions  Medication Sig Dispense Refill  . amLODipine (NORVASC) 10 MG tablet Take 10 mg by mouth daily.      [name] aspirin 81 MG tablet Take 81 mg by mouth daily.      [name] atenolol (TENORMIN) 100 MG tablet Take 100 mg by mouth daily.      . budesonide-formoterol (SYMBICORT) 80-4.5 MCG/ACT inhaler Inhale 2 puffs into the lungs 2 (two) times daily.      [name] ezetimibe-simvastatin (VYTORIN) 10-10 MG per tablet Take 1 tablet by mouth at bedtime.      . fluticasone (FLONASE) 50 MCG/ACT nasal spray Place 2 sprays into the nose daily.      [name] loratadine (CLARITIN) 10 MG tablet Take 10 mg by mouth daily.      [name] zolpidem (AMBIEN) 10 MG tablet Take 10 mg by mouth at bedtime as needed.      [name] atorvastatin (LIPITOR) 80 MG tablet Take 80 mg by mouth daily.      . clopidogrel (PLAVIX) 75 MG tablet Take 1 tablet (75 mg total) by mouth daily.  30 tablet  0  . doxycycline (VIBRAMYCIN) 50 MG capsule Take 50 mg by mouth daily.      . fenofibrate (LOFIBRA) 160 MG tablet Take 160 mg by mouth daily.      . NON FORMULARY IV Immunoglobulin quarterly      . omeprazole (PRILOSEC) 20 MG capsule Take 20 mg by mouth daily.         Objective: Pleasant alert man looking stated age in no acute distress Filed Vitals:   02/09/12 1100  BP: 146/84  Pulse: 54  Temp: 98.5 F (36.9 C)    BMI: Body mass index is 26.37 kg/(m^2).   ECOG FS:  Physical Exam:   Sclerae unicteric  Oropharynx clear  No peripheral  adenopathy  Lungs clear -- no rales or rhonchi  Heart regular rate and rhythm  Abdomen benign  MSK no focal spinal tenderness, no peripheral edema  Neuro nonfocal   Lab Results:      Chemistry      Component Value Date/Time   NA 141 11/10/2012 0513   NA 139 11/08/2012 0746   K 4.3 11/10/2012 0513   K 4.1 11/08/2012 0746   CL 108 11/10/2012 0513   CL 106 11/08/2012 0746   CO2 27 11/10/2012 0513   CO2 28 11/08/2012 0746   BUN 14 11/10/2012 0513   BUN 20.0 11/08/2012 0746   CREATININE 1.29 11/10/2012 0513   CREATININE 1.4* 11/08/2012 0746      Component Value Date/Time   CALCIUM 8.4 11/10/2012 0513   CALCIUM 9.9 11/08/2012 0746   ALKPHOS 27* 11/08/2012 0746   ALKPHOS 27* 08/09/2012 0757   AST 17 11/08/2012 0746   AST 15 08/09/2012 0757   ALT 17 11/08/2012 0746   ALT 12 08/09/2012 0757   BILITOT 0.84 11/08/2012 0746   BILITOT 0.4 08/09/2012 0757       Lab  Results  Component Value Date   WBC 6.7 12/20/2012   HGB 12.7* 12/20/2012   HCT 37.9* 12/20/2012   MCV 89.6 12/20/2012   PLT 227 12/20/2012   NEUTROABS 4.1 12/20/2012    Studies/Results:  No results found.  Assessment: History of recurrent plasmacytomas status post stem cell transplant now 6 years out in stable condition and in apparent remission based on recent labs. He has not had recurrent sinus and infections and will continue IVIG every 3 months.       [name] 01/20/2013

## 2013-01-21 ENCOUNTER — Encounter: Payer: Self-pay | Admitting: Oncology

## 2013-01-21 ENCOUNTER — Other Ambulatory Visit: Payer: Self-pay | Admitting: Oncology

## 2013-01-21 DIAGNOSIS — D801 Nonfamilial hypogammaglobulinemia: Secondary | ICD-10-CM

## 2013-01-21 HISTORY — DX: Nonfamilial hypogammaglobulinemia: D80.1

## 2013-01-24 ENCOUNTER — Ambulatory Visit (HOSPITAL_BASED_OUTPATIENT_CLINIC_OR_DEPARTMENT_OTHER): Payer: BC Managed Care – PPO

## 2013-01-24 ENCOUNTER — Telehealth: Payer: Self-pay | Admitting: *Deleted

## 2013-01-24 ENCOUNTER — Other Ambulatory Visit (HOSPITAL_BASED_OUTPATIENT_CLINIC_OR_DEPARTMENT_OTHER): Payer: BC Managed Care – PPO | Admitting: Lab

## 2013-01-24 VITALS — BP 137/82 | HR 83 | Temp 100.1°F | Resp 18

## 2013-01-24 DIAGNOSIS — C9001 Multiple myeloma in remission: Secondary | ICD-10-CM

## 2013-01-24 DIAGNOSIS — D801 Nonfamilial hypogammaglobulinemia: Secondary | ICD-10-CM

## 2013-01-24 DIAGNOSIS — D809 Immunodeficiency with predominantly antibody defects, unspecified: Secondary | ICD-10-CM

## 2013-01-24 LAB — CBC WITH DIFFERENTIAL/PLATELET
Eosinophils Absolute: 0.1 10*3/uL (ref 0.0–0.5)
HCT: 40.3 % (ref 38.4–49.9)
HGB: 13 g/dL (ref 13.0–17.1)
LYMPH%: 17.3 % (ref 14.0–49.0)
MONO#: 0.6 10*3/uL (ref 0.1–0.9)
NEUT#: 4.3 10*3/uL (ref 1.5–6.5)
NEUT%: 71.2 % (ref 39.0–75.0)
Platelets: 153 10*3/uL (ref 140–400)
WBC: 6 10*3/uL (ref 4.0–10.3)

## 2013-01-24 LAB — COMPREHENSIVE METABOLIC PANEL (CC13)
Albumin: 3.8 g/dL (ref 3.5–5.0)
Alkaline Phosphatase: 27 U/L — ABNORMAL LOW (ref 40–150)
BUN: 21.8 mg/dL (ref 7.0–26.0)
CO2: 23 mEq/L (ref 22–29)
Glucose: 91 mg/dl (ref 70–99)
Potassium: 4.2 mEq/L (ref 3.5–5.1)
Total Bilirubin: 0.43 mg/dL (ref 0.20–1.20)

## 2013-01-24 MED ORDER — IMMUNE GLOBULIN (HUMAN) 20 GM/200ML IV SOLN
80.0000 g | Freq: Once | INTRAVENOUS | Status: AC
  Administered 2013-01-24: 80 g via INTRAVENOUS
  Filled 2013-01-24: qty 800

## 2013-01-24 MED ORDER — ACETAMINOPHEN 325 MG PO TABS
650.0000 mg | ORAL_TABLET | Freq: Once | ORAL | Status: AC
  Administered 2013-01-24: 650 mg via ORAL

## 2013-01-24 MED ORDER — DIPHENHYDRAMINE HCL 50 MG/ML IJ SOLN
25.0000 mg | Freq: Once | INTRAMUSCULAR | Status: AC
  Administered 2013-01-24: 25 mg via INTRAVENOUS

## 2013-01-24 MED ORDER — IMMUNE GLOBULIN (HUMAN) 5 GM/100ML IV SOLN: 0.5000 g/kg | Freq: Once | INTRAVENOUS | Status: DC

## 2013-01-24 NOTE — Progress Notes (Signed)
At end of IVIG infusion pt had elevated temp at 100.1.  Dr. Darnelle Catalan notified.  Orders received to give pt Tylenol and DC home.

## 2013-01-24 NOTE — Telephone Encounter (Signed)
Per staff message and POF I have scheduled appts.  JMW  

## 2013-01-24 NOTE — Patient Instructions (Addendum)

## 2013-01-25 ENCOUNTER — Other Ambulatory Visit: Payer: PRIVATE HEALTH INSURANCE | Admitting: Lab

## 2013-01-25 ENCOUNTER — Ambulatory Visit: Payer: PRIVATE HEALTH INSURANCE | Admitting: Oncology

## 2013-01-26 ENCOUNTER — Telehealth: Payer: Self-pay | Admitting: Oncology

## 2013-01-26 LAB — IMMUNOFIXATION ELECTROPHORESIS
IgA: 346 mg/dL (ref 68–379)
IgM, Serum: 64 mg/dL (ref 41–251)

## 2013-01-26 LAB — KAPPA/LAMBDA LIGHT CHAINS
Kappa free light chain: 1.56 mg/dL (ref 0.33–1.94)
Lambda Free Lght Chn: 1.71 mg/dL (ref 0.57–2.63)

## 2013-01-26 NOTE — Telephone Encounter (Signed)
S/w pt re IVIG 5/56 and confirmed 3/17 appts.

## 2013-02-05 ENCOUNTER — Other Ambulatory Visit: Payer: Self-pay

## 2013-02-08 ENCOUNTER — Encounter: Payer: Self-pay | Admitting: Cardiology

## 2013-03-07 ENCOUNTER — Other Ambulatory Visit (HOSPITAL_BASED_OUTPATIENT_CLINIC_OR_DEPARTMENT_OTHER): Payer: BC Managed Care – PPO | Admitting: Lab

## 2013-03-07 ENCOUNTER — Ambulatory Visit (HOSPITAL_BASED_OUTPATIENT_CLINIC_OR_DEPARTMENT_OTHER): Payer: BC Managed Care – PPO | Admitting: Oncology

## 2013-03-07 ENCOUNTER — Telehealth: Payer: Self-pay | Admitting: Oncology

## 2013-03-07 VITALS — BP 155/89 | HR 62 | Temp 98.4°F | Resp 18 | Ht 69.0 in | Wt 173.6 lb

## 2013-03-07 NOTE — Telephone Encounter (Signed)
, °

## 2013-03-07 NOTE — Patient Instructions (Signed)
Bone X rays and lab  Monday 3/24 Dr visit 08/04/13 @ 2:30

## 2013-03-08 NOTE — Progress Notes (Signed)
Hematology and Oncology Follow Up Visit  [name] [id number] 1955/12/23 57 y.o. 03/08/2013 7:45 PM   Principle Diagnosis: Encounter Diagnoses  Name Primary?  . MULTIPLE MYELOMA, IN REMISSION Yes  . HIP REPLACEMENT, RIGHT, HX OF      Interim History: I will be assuming the hematology care of this patient formerly followed by Dr. Donnie Coffin.  57 year old man who used to work in the [employer] business. He presented with a one-month history of right groin pain at age 29 in March of 2004. Bone scan showed some subtle abnormalities but was nondiagnostic done 01/12/2003. He underwent an MRI of the hip which showed a 3 cm expansile lesion in the right acetabulum. He was initially evaluated at [facility]. A CT guided biopsy was positive for plasma cells. He was referred to Dr. Donnie Coffin. Bone marrow biopsy done 03/12/2003 showed only 5% plasma cells. He had a mild elevation of IgA immunoglobulin at 477 mg; IgG kappa on IFE percent. Urine was negative for free light chains. Serum beta 2 microglobulin 2.38. He underwent initial radiation 20 gray 10 fractions between 329 and 04/04/2003. He did well until February 2005 when he had recurrent right hip pain. MRI done February 17, 2004 showed a recurrent large lesion measuring 2.6 x 7.7 cm. He received additional radiation 45 gray in 25 fractions between March 28 and 04/19/2004. In October 2005, a soft tissue lesion was noted on the lateral aspect of his left leg. This was excised in December and found to be a plasmacytoma.[name] He received local radiation 30 gray in 10 fractions between January 4 and 12/28/2004. Due to another local recurrence in the right hip, he underwent a total right hip replacement by Dr. Lequita Halt in February 2006. In April 2006 he developed a soft tissue lesion on the left chest wall and again received radiation 30 gray in 10 fractions between July 17 and July 28 06. He was started on pulse dexamethasone in August 2006 and then a  combination of Velcade, dexamethasone, and thalidomide beginning 09/08/2005. He received 5 cycles of treatment through 12/01/2005. He developed a steroid myopathy. He was referred to [facility] where he underwent high dose intravenous melphalan chemotherapy 200 mg per meter squared with autologous stem cell support on 02/25/2006. He achieved a durable remission subsequent to those treatments.  He last had a CT scan of the abdomen and pelvis on 07/14/2012 when he developed lower abdominal pain felt to be an episode of diverticulitis. There was significant artifact from the right hip replacement but no gross changes in the bone seen at that time. Last metastatic bone survey done in June 2010.  As a consequence of his myeloma and its treatments, he has developed hypogammaglobulinemia and has had problems with recurrent sinopulmonary infections. He was started on a program of monthly intravenous immunoglobulin 4 years ago with excellent results and decrease in the frequency and severity of infections. Frequency of the IVIG infusions has been subsequently decreased to every 3 months.  Additional medical problems include: Coronary artery disease. Initial coronary bypass surgery at [facility] in [place] in 1994. More recently, he had a myocardial infarction in June 2012 and required stent placement. Cardiologist is Dr. Tollie Pizza at [facility]. Hypertension Hyperlipidemia GERD Peripheral neuropathy from previous chemotherapy Episode of diverticulitis a year ago. Most recent colonoscopy by Dr. Leone Payor of November 2013 showed severe diverticulosis in the left colon, 2 small sessile polyps removed from the sigmoid.  Medications: reviewed:  Norvasc, aspirin, atenolol, Lipitor, Plavix, fenofibrate, Flonase, Claritin, Prilosec, Ambien.  Allergies: No Known Allergies  Review of Systems: Constitutional:   No constitutional symptoms Respiratory: No cough or  dyspnea Cardiovascular:  No chest pain or palpitations Gastrointestinal: No abdominal pain or change in bowel habit Genito-Urinary: No urinary tract symptoms Musculoskeletal: He's been having some intermittent pain in the right inguinal area which lasts for a few seconds at a time, comes on suddenly, not worse with movement. Neurologic: No headache or change in vision Skin: No rash or ecchymosis Remaining ROS negative.  Physical Exam: Blood pressure 155/89, pulse 62, temperature 98.4 F (36.9 C), temperature source Oral, resp. rate 18, height 5\' 9"  (1.753 m), weight 173 lb 9.6 oz (78.744 kg). Wt Readings from Last 3 Encounters:  03/07/13 173 lb 9.6 oz (78.744 kg)  12/07/12 173 lb 12.8 oz (78.835 kg)  11/09/12 170 lb 3.1 oz (77.2 kg)     General appearance: Well-nourished Caucasian man HENNT: He is bald, pharynx no erythema exudate or ulcer Lymph nodes: No adenopathy Breasts: Lungs: Clear to auscultation resonant to percussion Heart: Regular rhythm no murmur Abdomen: Soft, nontender, no mass, no organomegaly Extremities: No edema, no calf tenderness Vascular: No cyanosis Neurologic: Motor strength 5 over 5, reflexes absent symmetric, sensation intact to vibration over the fingertips by tuning fork exam. Feet not examined Skin: No rash or ecchymosis GU: Circumcised penis. No inguinal adenopathy or tenderness on palpation in the right groin  Lab Results: Lab Results  Component Value Date   WBC 6.0 01/24/2013   HGB 13.0 01/24/2013   HCT 40.3 01/24/2013   MCV 90.8 01/24/2013   PLT 153 01/24/2013     Chemistry      Component Value Date/Time   NA 144 01/24/2013 0820   NA 141 11/10/2012 0513   K 4.2 01/24/2013 0820   K 4.3 11/10/2012 0513   CL 108* 01/24/2013 0820   CL 108 11/10/2012 0513   CO2 23 01/24/2013 0820   CO2 27 11/10/2012 0513   BUN 21.8 01/24/2013 0820   BUN 14 11/10/2012 0513   CREATININE 1.4* 01/24/2013 0820   CREATININE 1.29 11/10/2012 0513      Component Value Date/Time    CALCIUM 9.8 01/24/2013 0820   CALCIUM 8.4 11/10/2012 0513   ALKPHOS 27* 01/24/2013 0820   ALKPHOS 27* 08/09/2012 0757   AST 24 01/24/2013 0820   AST 15 08/09/2012 0757   ALT 28 01/24/2013 0820   ALT 12 08/09/2012 0757   BILITOT 0.43 01/24/2013 0820   BILITOT 0.4 08/09/2012 0757    Kappa serum free light chain 1.56, lambda 1.71, ratio 0.91 01/24/13 IgA 346 mg% no monoclonal proteins on IFE   Impression and Plan:  #1. IgG kappa multiple myeloma characterized by recurrent plasmacytomas treated as outlined above He remains in a durable remission now out 7 years from high-dose IV melphalan with autologous stem cell support on no maintenance therapy. Plan: Continue periodic followup. He has not had a metastatic bone survey in 4 years. I will get one now in view of the intermittent right groin pain and history of heavy hip involvement with recurrent plasmacytomas.  #2. Hypogammaglobulinemia secondary to myeloma and treatment He remains stable with resolution of recurrent sinopulmonary infections now on every three-month intravenous immunoglobulin replacement.  #3. Cardiovascular disease with prior coronary bypass surgery and now 2 years status post MI requiring coronary stenting  #4. Hypertension  #5. Hyperlipidemia  #6. Diverticulosis   #7. GERD  #8. Chronic renal insufficiency  #  9. Chemotherapy-related peripheral neuropathy    CC:.  Dr. Montel Culver Avva; Dr. Margaretmary Dys; Dr.Carl Leone Payor; Dr. Ollen Gross; Dr Elmon Kirsch[facility]   Levert Feinstein, MD 3/18/20147:45 PM

## 2013-03-14 ENCOUNTER — Other Ambulatory Visit (HOSPITAL_COMMUNITY): Payer: BC Managed Care – PPO

## 2013-03-14 ENCOUNTER — Other Ambulatory Visit (HOSPITAL_BASED_OUTPATIENT_CLINIC_OR_DEPARTMENT_OTHER): Payer: BC Managed Care – PPO | Admitting: Lab

## 2013-03-14 ENCOUNTER — Other Ambulatory Visit: Payer: Self-pay | Admitting: Oncology

## 2013-03-14 ENCOUNTER — Ambulatory Visit (HOSPITAL_COMMUNITY)
Admission: RE | Admit: 2013-03-14 | Discharge: 2013-03-14 | Disposition: A | Discharge status: Home / Self Care | Payer: BC Managed Care – PPO | Source: Ambulatory Visit | Attending: Oncology | Admitting: Oncology

## 2013-03-14 DIAGNOSIS — C9001 Multiple myeloma in remission: Secondary | ICD-10-CM

## 2013-03-14 DIAGNOSIS — Z96649 Presence of unspecified artificial hip joint: Secondary | ICD-10-CM | POA: Insufficient documentation

## 2013-03-14 DIAGNOSIS — R109 Unspecified abdominal pain: Secondary | ICD-10-CM | POA: Insufficient documentation

## 2013-03-14 DIAGNOSIS — C9000 Multiple myeloma not having achieved remission: Secondary | ICD-10-CM

## 2013-03-14 LAB — CBC WITH DIFFERENTIAL/PLATELET
BASO%: 0.7 % (ref 0.0–2.0)
Eosinophils Absolute: 0.1 10*3/uL (ref 0.0–0.5)
HCT: 39 % (ref 38.4–49.9)
LYMPH%: 32 % (ref 14.0–49.0)
MCHC: 33.6 g/dL (ref 32.0–36.0)
MONO#: 0.7 10*3/uL (ref 0.1–0.9)
NEUT#: 2.5 10*3/uL (ref 1.5–6.5)
NEUT%: 51.3 % (ref 39.0–75.0)
Platelets: 186 10*3/uL (ref 140–400)
WBC: 4.9 10*3/uL (ref 4.0–10.3)
lymph#: 1.6 10*3/uL (ref 0.9–3.3)

## 2013-03-14 LAB — COMPREHENSIVE METABOLIC PANEL (CC13)
ALT: 16 U/L (ref 0–55)
BUN: 20.8 mg/dL (ref 7.0–26.0)
CO2: 26 mEq/L (ref 22–29)
Calcium: 9.5 mg/dL (ref 8.4–10.4)
Chloride: 109 mEq/L — ABNORMAL HIGH (ref 98–107)
Creatinine: 1.4 mg/dL — ABNORMAL HIGH (ref 0.7–1.3)
Total Bilirubin: 0.42 mg/dL (ref 0.20–1.20)

## 2013-03-15 ENCOUNTER — Telehealth: Payer: Self-pay | Admitting: *Deleted

## 2013-03-15 NOTE — Telephone Encounter (Signed)
Message copied by Orbie Hurst on Tue Mar 15, 2013 11:18 AM ------      Message from: Levert Feinstein      Created: Mon Mar 14, 2013  6:36 PM       Call pt - one very questionable new change on bone Xrays right hip - we will schedule an MRI for a closer look ------

## 2013-03-15 NOTE — Telephone Encounter (Signed)
Spoke with pt.  Let him know one very questionable change on bone xrays right hip-  MRI has already called him and he has an appt. For Thursday 3/27.

## 2013-03-16 ENCOUNTER — Telehealth: Payer: Self-pay | Admitting: Internal Medicine

## 2013-03-16 LAB — IMMUNOFIXATION ELECTROPHORESIS
IgA: 313 mg/dL (ref 68–379)
IgM, Serum: 48 mg/dL (ref 41–251)
Total Protein, Serum Electrophoresis: 7.2 g/dL (ref 6.0–8.3)

## 2013-03-16 LAB — KAPPA/LAMBDA LIGHT CHAINS: Kappa:Lambda Ratio: 0.84 (ref 0.26–1.65)

## 2013-03-16 NOTE — Telephone Encounter (Signed)
Dr Cyndie Chime scheduled the MRI so I advised the pt to call that office and make them aware and also call [facility] radilology to make them aware that he has clips placed by Dr Leone Payor.  Pt agreed and will call Dr Cyndie Chime

## 2013-03-17 ENCOUNTER — Ambulatory Visit (HOSPITAL_COMMUNITY)
Admission: RE | Admit: 2013-03-17 | Discharge: 2013-03-17 | Disposition: A | Discharge status: Home / Self Care | Payer: BC Managed Care – PPO | Source: Ambulatory Visit | Attending: Oncology | Admitting: Oncology

## 2013-03-17 DIAGNOSIS — C9001 Multiple myeloma in remission: Secondary | ICD-10-CM

## 2013-03-18 ENCOUNTER — Telehealth: Payer: Self-pay | Admitting: *Deleted

## 2013-03-18 NOTE — Telephone Encounter (Signed)
[name] with MRI wanted to make sure that Dr Cyndie Chime was aware that MRI would not be useful on patient at this time. Called and spoke to patient to inform him we are aware of situation, he states he has already been in touch with [name] via email and he was aware.  See notes from [name] 03/18/13.

## 2013-03-18 NOTE — Telephone Encounter (Signed)
Message copied by Sabino Snipes on Fri Mar 18, 2013  3:51 PM ------      Message from: Levert Feinstein      Created: Fri Mar 18, 2013 12:17 AM       Call patient - no need for further Xrays - radiologist reviewed previous Xrays and now states low suspicion for new bone changes.  Lab tests for abnormal antibodies are normal - also supports that there is no recurrence of myeloma ------

## 2013-03-18 NOTE — Telephone Encounter (Signed)
Pt notified of good report of x-rays per Dr Cyndie Chime.  Pt expressed appreciation.

## 2013-03-21 ENCOUNTER — Other Ambulatory Visit: Payer: Self-pay | Admitting: Oncology

## 2013-03-21 DIAGNOSIS — C9001 Multiple myeloma in remission: Secondary | ICD-10-CM

## 2013-03-22 ENCOUNTER — Telehealth: Payer: Self-pay | Admitting: Oncology

## 2013-03-22 ENCOUNTER — Telehealth: Payer: Self-pay | Admitting: *Deleted

## 2013-03-22 NOTE — Telephone Encounter (Signed)
LMONVM ADVISING THE PT OF HIS LAB APPT ON 03/23/2013@10 :30AM

## 2013-03-22 NOTE — Telephone Encounter (Signed)
Pt called to cancel this appt he stated that he had labs last week and everything look fine.

## 2013-03-23 ENCOUNTER — Other Ambulatory Visit: Payer: BC Managed Care – PPO | Admitting: Lab

## 2013-04-19 ENCOUNTER — Other Ambulatory Visit: Payer: Self-pay | Admitting: Dermatology

## 2013-04-25 ENCOUNTER — Ambulatory Visit (HOSPITAL_BASED_OUTPATIENT_CLINIC_OR_DEPARTMENT_OTHER): Payer: BC Managed Care – PPO

## 2013-04-25 VITALS — BP 127/81 | HR 57 | Temp 97.8°F | Resp 18

## 2013-04-25 DIAGNOSIS — I10 Essential (primary) hypertension: Secondary | ICD-10-CM

## 2013-04-25 DIAGNOSIS — G4700 Insomnia, unspecified: Secondary | ICD-10-CM

## 2013-04-25 DIAGNOSIS — Z86010 Personal history of colonic polyps: Secondary | ICD-10-CM

## 2013-04-25 DIAGNOSIS — D801 Nonfamilial hypogammaglobulinemia: Secondary | ICD-10-CM

## 2013-04-25 DIAGNOSIS — D809 Immunodeficiency with predominantly antibody defects, unspecified: Secondary | ICD-10-CM

## 2013-04-25 DIAGNOSIS — C9001 Multiple myeloma in remission: Secondary | ICD-10-CM

## 2013-04-25 DIAGNOSIS — I25810 Atherosclerosis of coronary artery bypass graft(s) without angina pectoris: Secondary | ICD-10-CM

## 2013-04-25 DIAGNOSIS — Z96649 Presence of unspecified artificial hip joint: Secondary | ICD-10-CM

## 2013-04-25 DIAGNOSIS — J309 Allergic rhinitis, unspecified: Secondary | ICD-10-CM

## 2013-04-25 MED ORDER — IMMUNE GLOBULIN (HUMAN) 10 GM/200ML IV SOLN
80.0000 g | Freq: Once | INTRAVENOUS | Status: DC
  Filled 2013-04-25: qty 1600

## 2013-04-25 MED ORDER — IMMUNE GLOBULIN (HUMAN) 5 GM/100ML IV SOLN
40.0000 g | Freq: Once | INTRAVENOUS | Status: DC
  Filled 2013-04-25: qty 800

## 2013-04-25 MED ORDER — ACETAMINOPHEN 325 MG PO TABS
650.0000 mg | ORAL_TABLET | Freq: Once | ORAL | Status: AC
  Administered 2013-04-25: 650 mg via ORAL

## 2013-04-25 MED ORDER — ALTEPLASE 2 MG IJ SOLR
2.0000 mg | Freq: Once | INTRAMUSCULAR | Status: DC | PRN
  Filled 2013-04-25: qty 2

## 2013-04-25 MED ORDER — IMMUNE GLOBULIN (HUMAN) 20 GM/200ML IV SOLN
80.0000 g | Freq: Once | INTRAVENOUS | Status: AC
  Administered 2013-04-25: 80 g via INTRAVENOUS
  Filled 2013-04-25: qty 800

## 2013-04-25 MED ORDER — DIPHENHYDRAMINE HCL 25 MG PO TABS
25.0000 mg | ORAL_TABLET | Freq: Once | ORAL | Status: AC
  Administered 2013-04-25: 25 mg via ORAL
  Filled 2013-04-25: qty 1

## 2013-04-25 NOTE — Patient Instructions (Signed)

## 2013-07-12 ENCOUNTER — Other Ambulatory Visit: Payer: Self-pay | Admitting: Oncology

## 2013-07-12 DIAGNOSIS — C9001 Multiple myeloma in remission: Secondary | ICD-10-CM

## 2013-07-14 ENCOUNTER — Telehealth: Payer: Self-pay | Admitting: Oncology

## 2013-07-14 NOTE — Telephone Encounter (Signed)
S/w pt re appt for 7/28. Per email from [place] and lab order entered scheduled lb 7/28.

## 2013-07-18 ENCOUNTER — Other Ambulatory Visit (HOSPITAL_BASED_OUTPATIENT_CLINIC_OR_DEPARTMENT_OTHER): Payer: BC Managed Care – PPO

## 2013-07-18 DIAGNOSIS — C9001 Multiple myeloma in remission: Secondary | ICD-10-CM

## 2013-07-18 LAB — CBC WITH DIFFERENTIAL/PLATELET
BASO%: 0.8 % (ref 0.0–2.0)
Basophils Absolute: 0 10*3/uL (ref 0.0–0.1)
Eosinophils Absolute: 0.1 10*3/uL (ref 0.0–0.5)
HCT: 40.7 % (ref 38.4–49.9)
HGB: 13.4 g/dL (ref 13.0–17.1)
LYMPH%: 33.9 % (ref 14.0–49.0)
MONO#: 0.7 10*3/uL (ref 0.1–0.9)
NEUT#: 3.1 10*3/uL (ref 1.5–6.5)
NEUT%: 51.7 % (ref 39.0–75.0)
Platelets: 193 10*3/uL (ref 140–400)
WBC: 6 10*3/uL (ref 4.0–10.3)
lymph#: 2 10*3/uL (ref 0.9–3.3)

## 2013-07-18 LAB — COMPREHENSIVE METABOLIC PANEL (CC13)
ALT: 12 U/L (ref 0–55)
BUN: 21.6 mg/dL (ref 7.0–26.0)
CO2: 26 mEq/L (ref 22–29)
Calcium: 9.8 mg/dL (ref 8.4–10.4)
Chloride: 108 mEq/L (ref 98–109)
Creatinine: 1.4 mg/dL — ABNORMAL HIGH (ref 0.7–1.3)
Glucose: 87 mg/dl (ref 70–140)
Total Bilirubin: 0.67 mg/dL (ref 0.20–1.20)

## 2013-07-20 LAB — IMMUNOFIXATION ELECTROPHORESIS
IgG (Immunoglobin G), Serum: 760 mg/dL (ref 650–1600)
IgM, Serum: 49 mg/dL (ref 41–251)
Total Protein, Serum Electrophoresis: 7.2 g/dL (ref 6.0–8.3)

## 2013-07-20 LAB — KAPPA/LAMBDA LIGHT CHAINS: Kappa:Lambda Ratio: 0.99 (ref 0.26–1.65)

## 2013-07-22 LAB — UIFE/LIGHT CHAINS/TP QN, 24-HR UR
Free Kappa Lt Chains,Ur: 0.13 mg/dL — ABNORMAL LOW (ref 0.14–2.42)
Free Lt Chn Excr Rate: 3.25 mg/d
Volume, Urine: 2500 mL

## 2013-08-01 ENCOUNTER — Telehealth: Payer: Self-pay | Admitting: *Deleted

## 2013-08-01 ENCOUNTER — Telehealth: Payer: Self-pay | Admitting: Oncology

## 2013-08-01 ENCOUNTER — Ambulatory Visit (HOSPITAL_BASED_OUTPATIENT_CLINIC_OR_DEPARTMENT_OTHER): Payer: BC Managed Care – PPO | Admitting: Oncology

## 2013-08-01 VITALS — BP 155/82 | HR 57 | Temp 98.1°F | Resp 20 | Ht 69.0 in | Wt 174.5 lb

## 2013-08-01 DIAGNOSIS — D801 Nonfamilial hypogammaglobulinemia: Secondary | ICD-10-CM

## 2013-08-01 DIAGNOSIS — C9001 Multiple myeloma in remission: Secondary | ICD-10-CM

## 2013-08-01 DIAGNOSIS — D839 Common variable immunodeficiency, unspecified: Secondary | ICD-10-CM

## 2013-08-01 NOTE — Telephone Encounter (Signed)
Per staff message and POF I have scheduled appts.  JMW  

## 2013-08-01 NOTE — Progress Notes (Signed)
Hematology and Oncology Follow Up Visit  [name] [id number] 1956/05/28 57 y.o. 08/01/2013 1:21 PM   Principle Diagnosis: Encounter Diagnoses  Name Primary?  . MULTIPLE MYELOMA, IN REMISSION Yes  . Hypogammaglobulinemia, acquired      Interim History:   Followup visit for this 57 year old man with IgG kappa multiple myeloma currently in remission. He was diagnosed in March of 2004 when he presented with right groin pain and was found to have an expansile lesion involving the right acetabulum with biopsies showing plasma cell dyscrasia. Please see my summary note dated 03/07/2013 for complete details. He underwent initial radiation therapy. Initial bone marrow only showed 5% plasma cells. About one year later in February 2005 he presented with recurrent right hip pain and was found to have a large plasmacytoma in the same area as the initial disease. He received an additional course of radiation. He developed a soft tissue lesion lateral aspect left leg October 2005 which was excised and found to be a plasmacytoma. He received additional radiation. He had another local recurrence in the area of the right hip and underwent right hip replacement February 2006. An additional soft tissue lesion developed on the left chest wall in April 2006 treated with radiation. He was started on thalidomide Velcade dexamethasone in September 2006. He received high-dose IV melphalan with stem cell support at [facility] 02/25/2006. He achieved a durable remission with no signs of recurrence now for over 7 years.  At time of his first visit with me on 03/07/2013, he was complaining of some intermittent low-grade right groin pain. I obtained regular x-rays which did not show any gross evidence for recurrence. His symptoms spontaneously resolved.  He continues on every 3 month intravenous immunoglobulin infusions 1 g per kilogram one day treatment for treatment-related hypogammaglobulinemia.  He is  doing well at this time. He reports no areas of bone pain. He gets occasional aches and pains. He is getting more muscle cramps in his calves at night. He just celebrated his 36th wedding anniversary with a nice trip to [place] with his wife.  He has had no interim infections.  Medications: reviewed  Allergies: No Known Allergies  Review of Systems: Constitutional:   No constitutional symptoms Respiratory: No cough or dyspnea Cardiovascular:  No chest pain or palpitations Gastrointestinal: No abdominal pain or change in bowel habit Genito-Urinary: No urinary tract symptoms Musculoskeletal: See above Neurologic: No headache or change in vision Skin: No rash Remaining ROS negative.  Physical Exam: Blood pressure 155/82, pulse 57, temperature 98.1 F (36.7 C), temperature source Oral, resp. rate 20, height 5\' 9"  (1.753 m), weight 174 lb 8 oz (79.153 kg). Wt Readings from Last 3 Encounters:  08/01/13 174 lb 8 oz (79.153 kg)  03/07/13 173 lb 9.6 oz (78.744 kg)  12/07/12 173 lb 12.8 oz (78.835 kg)     General appearance: Well-nourished Caucasian man HENNT: Head is shaved bald. Pharynx no erythema exudate or ulcer Lymph nodes: No adenopathy Breasts: Lungs: Clear to auscultation resonant to percussion Heart: Regular rhythm no murmur Abdomen: Soft, nontender, no mass, no organomegaly Extremities: No edema, no calf tenderness Musculoskeletal: No joint deformities. Status post right hip replacement GU: Vascular: No cyanosis. No carotid bruits Neurologic: Mental status intact, cranial nerves grossly normal, PERRLA, motor strength 5 over 5, reflexes 1+ symmetric, sensation intact to vibration over the fingertips Skin: No rash  Lab Results: Lab Results  Component Value Date   WBC 6.0 07/18/2013   HGB 13.4 07/18/2013  HCT 40.7 07/18/2013   MCV 88.0 07/18/2013   PLT 193 07/18/2013     Chemistry      Component Value Date/Time   NA 146* 07/18/2013 1319   NA 141 11/10/2012 0513   K 4.5  07/18/2013 1319   K 4.3 11/10/2012 0513   CL 109* 03/14/2013 0835   CL 108 11/10/2012 0513   CO2 26 07/18/2013 1319   CO2 27 11/10/2012 0513   BUN 21.6 07/18/2013 1319   BUN 14 11/10/2012 0513   CREATININE 1.4* 07/18/2013 1319   CREATININE 1.29 11/10/2012 0513      Component Value Date/Time   CALCIUM 9.8 07/18/2013 1319   CALCIUM 8.4 11/10/2012 0513   ALKPHOS 26* 07/18/2013 1319   ALKPHOS 27* 08/09/2012 0757   AST 21 07/18/2013 1319   AST 15 08/09/2012 0757   ALT 12 07/18/2013 1319   ALT 12 08/09/2012 0757   BILITOT 0.67 07/18/2013 1319   BILITOT 0.4 08/09/2012 0757      Radiological Studies: Metastatic bone survey done 03/14/2013 felt to be stable with no new areas of suspicious abnormalities. There was interval osteophyte development adjacent to the area of his hip replacement.   Impression: #1. IgG kappa multiple myeloma characterized by recurrent plasmacytomas treated as outlined above  He remains in a durable remission now out over 7 years from high-dose IV melphalan with autologous stem cell support on no maintenance therapy.  Plan: Continue periodic followup.   #2. Hypogammaglobulinemia secondary to myeloma and treatment  He remains stable with resolution of recurrent sinopulmonary infections now on every three-month intravenous immunoglobulin replacement.   #3. Cardiovascular disease with prior coronary bypass surgery and now 2 years status post MI requiring coronary stenting  #4. Hypertension  #5. Hyperlipidemia  #6. Diverticulosis  #7. GERD  #8. Chronic renal insufficiency  #9. Chemotherapy-related peripheral neuropathy     CC:. Dr. Montel Culver Avva; Dr. Margaretmary Dys; Dr.Carl Leone Payor; Dr. Ollen Gross; Dr Elmon Kirschner [facility]       Levert Feinstein, MD 8/11/20141:21 PM

## 2013-08-01 NOTE — Telephone Encounter (Signed)
s.w pt and advise don all appt...pt ok and awre

## 2013-08-15 ENCOUNTER — Ambulatory Visit (HOSPITAL_BASED_OUTPATIENT_CLINIC_OR_DEPARTMENT_OTHER): Payer: BC Managed Care – PPO

## 2013-08-15 VITALS — BP 165/82 | HR 57 | Temp 98.2°F | Resp 18

## 2013-08-15 DIAGNOSIS — Z86010 Personal history of colonic polyps: Secondary | ICD-10-CM

## 2013-08-15 DIAGNOSIS — I25810 Atherosclerosis of coronary artery bypass graft(s) without angina pectoris: Secondary | ICD-10-CM

## 2013-08-15 DIAGNOSIS — Z96649 Presence of unspecified artificial hip joint: Secondary | ICD-10-CM

## 2013-08-15 DIAGNOSIS — J309 Allergic rhinitis, unspecified: Secondary | ICD-10-CM

## 2013-08-15 DIAGNOSIS — G4700 Insomnia, unspecified: Secondary | ICD-10-CM

## 2013-08-15 DIAGNOSIS — D809 Immunodeficiency with predominantly antibody defects, unspecified: Secondary | ICD-10-CM

## 2013-08-15 DIAGNOSIS — I10 Essential (primary) hypertension: Secondary | ICD-10-CM

## 2013-08-15 DIAGNOSIS — D801 Nonfamilial hypogammaglobulinemia: Secondary | ICD-10-CM

## 2013-08-15 DIAGNOSIS — C9001 Multiple myeloma in remission: Secondary | ICD-10-CM

## 2013-08-15 MED ORDER — DIPHENHYDRAMINE HCL 25 MG PO TABS
25.0000 mg | ORAL_TABLET | Freq: Once | ORAL | Status: AC
  Administered 2013-08-15: 25 mg via ORAL
  Filled 2013-08-15: qty 1

## 2013-08-15 MED ORDER — IMMUNE GLOBULIN (HUMAN) 20 GM/200ML IV SOLN
80.0000 g | Freq: Once | INTRAVENOUS | Status: AC
  Administered 2013-08-15: 80 g via INTRAVENOUS
  Filled 2013-08-15: qty 800

## 2013-08-15 MED ORDER — SODIUM CHLORIDE 0.9 % IJ SOLN
3.0000 mL | Freq: Once | INTRAMUSCULAR | Status: DC | PRN
  Filled 2013-08-15: qty 10

## 2013-08-15 MED ORDER — ACETAMINOPHEN 325 MG PO TABS
650.0000 mg | ORAL_TABLET | Freq: Once | ORAL | Status: AC
  Administered 2013-08-15: 650 mg via ORAL

## 2013-08-15 MED ORDER — IMMUNE GLOBULIN (HUMAN) 5 GM/100ML IV SOLN: 1.0000 g/kg | Freq: Once | INTRAVENOUS | Status: DC

## 2013-08-15 NOTE — Patient Instructions (Addendum)

## 2013-10-27 ENCOUNTER — Other Ambulatory Visit: Payer: Self-pay

## 2013-11-14 ENCOUNTER — Encounter: Payer: Self-pay | Admitting: *Deleted

## 2013-11-14 ENCOUNTER — Ambulatory Visit (HOSPITAL_BASED_OUTPATIENT_CLINIC_OR_DEPARTMENT_OTHER): Payer: BC Managed Care – PPO

## 2013-11-14 VITALS — BP 118/71 | HR 55 | Temp 98.2°F | Resp 18

## 2013-11-14 DIAGNOSIS — D809 Immunodeficiency with predominantly antibody defects, unspecified: Secondary | ICD-10-CM

## 2013-11-14 DIAGNOSIS — D801 Nonfamilial hypogammaglobulinemia: Secondary | ICD-10-CM

## 2013-11-14 MED ORDER — ACETAMINOPHEN 325 MG PO TABS
650.0000 mg | ORAL_TABLET | Freq: Once | ORAL | Status: AC
  Administered 2013-11-14: 650 mg via ORAL

## 2013-11-14 MED ORDER — ACETAMINOPHEN 325 MG PO TABS
ORAL_TABLET | ORAL | Status: AC
  Filled 2013-11-14: qty 2

## 2013-11-14 MED ORDER — SODIUM CHLORIDE 0.9 % IV SOLN
Freq: Once | INTRAVENOUS | Status: AC
  Administered 2013-11-14: 08:00:00 via INTRAVENOUS

## 2013-11-14 MED ORDER — DIPHENHYDRAMINE HCL 25 MG PO CAPS
25.0000 mg | ORAL_CAPSULE | Freq: Once | ORAL | Status: AC
  Administered 2013-11-14: 25 mg via ORAL

## 2013-11-14 MED ORDER — DIPHENHYDRAMINE HCL 25 MG PO CAPS
ORAL_CAPSULE | ORAL | Status: AC
  Filled 2013-11-14: qty 1

## 2013-11-14 MED ORDER — IMMUNE GLOBULIN (HUMAN) 20 GM/200ML IV SOLN
1.0000 g/kg | Freq: Once | INTRAVENOUS | Status: AC
  Administered 2013-11-14: 80 g via INTRAVENOUS
  Filled 2013-11-14: qty 800

## 2013-11-14 NOTE — Progress Notes (Addendum)
Patient discharged home by another RN.

## 2013-11-14 NOTE — Progress Notes (Signed)
Chaplain made initial visit. Pt said he was doing well and only comes in four times a year to boost his immune system. Pt said that he was from [place] and had moved down [place] because of his job. He did not seem to have further needs at this time.

## 2013-11-14 NOTE — Patient Instructions (Signed)

## 2014-02-13 ENCOUNTER — Ambulatory Visit (HOSPITAL_BASED_OUTPATIENT_CLINIC_OR_DEPARTMENT_OTHER): Payer: BC Managed Care – PPO

## 2014-02-13 VITALS — BP 155/81 | HR 56 | Temp 97.7°F | Resp 20

## 2014-02-13 DIAGNOSIS — D839 Common variable immunodeficiency, unspecified: Secondary | ICD-10-CM

## 2014-02-13 DIAGNOSIS — D801 Nonfamilial hypogammaglobulinemia: Secondary | ICD-10-CM

## 2014-02-13 MED ORDER — ACETAMINOPHEN 325 MG PO TABS
325.0000 mg | ORAL_TABLET | Freq: Once | ORAL | Status: AC
  Administered 2014-02-13: 325 mg via ORAL

## 2014-02-13 MED ORDER — DIPHENHYDRAMINE HCL 25 MG PO CAPS
ORAL_CAPSULE | ORAL | Status: AC
  Filled 2014-02-13: qty 1

## 2014-02-13 MED ORDER — ACETAMINOPHEN 325 MG PO TABS
ORAL_TABLET | ORAL | Status: AC
  Filled 2014-02-13: qty 1

## 2014-02-13 MED ORDER — IMMUNE GLOBULIN (HUMAN) 20 GM/200ML IV SOLN
1.0000 g/kg | Freq: Once | INTRAVENOUS | Status: AC
  Administered 2014-02-13: 80 g via INTRAVENOUS
  Filled 2014-02-13: qty 800

## 2014-02-13 MED ORDER — DIPHENHYDRAMINE HCL 25 MG PO CAPS
25.0000 mg | ORAL_CAPSULE | Freq: Once | ORAL | Status: AC
  Administered 2014-02-13: 25 mg via ORAL

## 2014-02-13 NOTE — Patient Instructions (Signed)

## 2014-02-13 NOTE — Progress Notes (Signed)
0835- Patient requesting Tylenol 325 mg and Benadryl 25 mg po prior to privigen, orders not in. Per Dr. Beryle Beams, okay to give.   1250- IVIG infusion completed without difficulty, 30 min observation completed.

## 2014-02-20 ENCOUNTER — Ambulatory Visit (HOSPITAL_COMMUNITY)
Admission: RE | Admit: 2014-02-20 | Discharge: 2014-02-20 | Disposition: A | Discharge status: Home / Self Care | Payer: BC Managed Care – PPO | Source: Ambulatory Visit | Attending: Oncology | Admitting: Oncology

## 2014-02-20 DIAGNOSIS — Z951 Presence of aortocoronary bypass graft: Secondary | ICD-10-CM | POA: Insufficient documentation

## 2014-02-20 DIAGNOSIS — C9001 Multiple myeloma in remission: Secondary | ICD-10-CM

## 2014-02-20 DIAGNOSIS — C9000 Multiple myeloma not having achieved remission: Secondary | ICD-10-CM | POA: Insufficient documentation

## 2014-02-20 DIAGNOSIS — M949 Disorder of cartilage, unspecified: Secondary | ICD-10-CM

## 2014-02-20 DIAGNOSIS — M899 Disorder of bone, unspecified: Secondary | ICD-10-CM | POA: Insufficient documentation

## 2014-02-22 ENCOUNTER — Telehealth: Payer: Self-pay | Admitting: *Deleted

## 2014-02-22 NOTE — Telephone Encounter (Signed)
Message copied by Norma Fredrickson on Wed Feb 22, 2014  3:31 PM ------      Message from: Ignacia Felling      Created: Wed Feb 22, 2014  3:27 PM                   ----- Message -----         From: Annia Belt, MD         Sent: 02/20/2014   8:24 PM           To: Ignacia Felling, RN, Jesse Fall, RN, #            Call pt: no new abnormalities on bone Xrays ------

## 2014-02-22 NOTE — Telephone Encounter (Signed)
Called and informed patient that bone Xrays show no new abnormalities.  Per Dr. Beryle Beams.  Patient verbalized understanding.

## 2014-02-23 ENCOUNTER — Telehealth: Payer: Self-pay | Admitting: *Deleted

## 2014-02-23 NOTE — Telephone Encounter (Signed)
Message copied by Jesse Fall on Thu Feb 23, 2014  4:49 PM ------      Message from: [name]      Created: Mon Feb 20, 2014  8:24 PM       Call pt: no new abnormalities on bone Xrays ------

## 2014-02-23 NOTE — Telephone Encounter (Signed)
Notified pt of bone survey results per Dr Azucena Freed instructions.

## 2014-02-27 ENCOUNTER — Ambulatory Visit (HOSPITAL_BASED_OUTPATIENT_CLINIC_OR_DEPARTMENT_OTHER): Payer: BC Managed Care – PPO | Admitting: Oncology

## 2014-02-27 ENCOUNTER — Encounter: Payer: Self-pay | Admitting: Oncology

## 2014-02-27 ENCOUNTER — Other Ambulatory Visit (HOSPITAL_BASED_OUTPATIENT_CLINIC_OR_DEPARTMENT_OTHER): Payer: BC Managed Care – PPO

## 2014-02-27 ENCOUNTER — Telehealth: Payer: Self-pay | Admitting: Oncology

## 2014-02-27 VITALS — BP 123/76 | HR 62 | Temp 98.4°F | Resp 18 | Ht 69.0 in | Wt 172.0 lb

## 2014-02-27 DIAGNOSIS — C9001 Multiple myeloma in remission: Secondary | ICD-10-CM

## 2014-02-27 DIAGNOSIS — D801 Nonfamilial hypogammaglobulinemia: Secondary | ICD-10-CM

## 2014-02-27 DIAGNOSIS — G620 Drug-induced polyneuropathy: Secondary | ICD-10-CM

## 2014-02-27 DIAGNOSIS — N189 Chronic kidney disease, unspecified: Secondary | ICD-10-CM

## 2014-02-27 DIAGNOSIS — IMO0002 Reserved for concepts with insufficient information to code with codable children: Secondary | ICD-10-CM

## 2014-02-27 DIAGNOSIS — D839 Common variable immunodeficiency, unspecified: Secondary | ICD-10-CM

## 2014-02-27 HISTORY — DX: Reserved for concepts with insufficient information to code with codable children: IMO0002

## 2014-02-27 LAB — CBC WITH DIFFERENTIAL/PLATELET
BASO%: 0.6 % (ref 0.0–2.0)
Basophils Absolute: 0 10*3/uL (ref 0.0–0.1)
EOS%: 0.8 % (ref 0.0–7.0)
Eosinophils Absolute: 0 10*3/uL (ref 0.0–0.5)
HCT: 41.9 % (ref 38.4–49.9)
HGB: 13.8 g/dL (ref 13.0–17.1)
LYMPH#: 1.8 10*3/uL (ref 0.9–3.3)
LYMPH%: 32.4 % (ref 14.0–49.0)
MCH: 29 pg (ref 27.2–33.4)
MCHC: 32.9 g/dL (ref 32.0–36.0)
MCV: 88.2 fL (ref 79.3–98.0)
MONO#: 0.6 10*3/uL (ref 0.1–0.9)
MONO%: 11.3 % (ref 0.0–14.0)
NEUT#: 3 10*3/uL (ref 1.5–6.5)
NEUT%: 54.9 % (ref 39.0–75.0)
Platelets: 215 10*3/uL (ref 140–400)
RBC: 4.75 10*6/uL (ref 4.20–5.82)
RDW: 13.5 % (ref 11.0–14.6)
WBC: 5.4 10*3/uL (ref 4.0–10.3)

## 2014-02-27 LAB — COMPREHENSIVE METABOLIC PANEL (CC13)
ALT: 16 U/L (ref 0–55)
ANION GAP: 8 meq/L (ref 3–11)
AST: 19 U/L (ref 5–34)
Albumin: 4.3 g/dL (ref 3.5–5.0)
Alkaline Phosphatase: 31 U/L — ABNORMAL LOW (ref 40–150)
BUN: 24.2 mg/dL (ref 7.0–26.0)
CALCIUM: 10 mg/dL (ref 8.4–10.4)
CHLORIDE: 106 meq/L (ref 98–109)
CO2: 28 mEq/L (ref 22–29)
Creatinine: 1.4 mg/dL — ABNORMAL HIGH (ref 0.7–1.3)
Glucose: 82 mg/dl (ref 70–140)
Potassium: 4.3 mEq/L (ref 3.5–5.1)
SODIUM: 142 meq/L (ref 136–145)
TOTAL PROTEIN: 8.5 g/dL — AB (ref 6.4–8.3)
Total Bilirubin: 0.57 mg/dL (ref 0.20–1.20)

## 2014-02-27 LAB — LACTATE DEHYDROGENASE (CC13): LDH: 157 U/L (ref 125–245)

## 2014-02-27 NOTE — Telephone Encounter (Signed)
gv pt appt schedule for may thru sept. fomer pt of JG to NG.

## 2014-03-01 LAB — KAPPA/LAMBDA LIGHT CHAINS
Kappa free light chain: 2.22 mg/dL — ABNORMAL HIGH (ref 0.33–1.94)
Kappa:Lambda Ratio: 0.93 (ref 0.26–1.65)
Lambda Free Lght Chn: 2.4 mg/dL (ref 0.57–2.63)

## 2014-03-01 LAB — IMMUNOFIXATION ELECTROPHORESIS
IGA: 312 mg/dL (ref 68–379)
IGM, SERUM: 65 mg/dL (ref 41–251)
IgG (Immunoglobin G), Serum: 1400 mg/dL (ref 650–1600)
TOTAL PROTEIN, SERUM ELECTROPHOR: 7.9 g/dL (ref 6.0–8.3)

## 2014-03-01 NOTE — Progress Notes (Signed)
Hematology and Oncology Follow Up Visit  [name] [id number] 08-May-1956 58 y.o. 03/01/2014 10:10 AM   Principle Diagnosis: Encounter Diagnoses  Name Primary?  . MULTIPLE MYELOMA, IN REMISSION Yes  . Hypogammaglobulinemia, acquired   . Peripheral neuropathy, secondary to drugs or chemicals      Interim History: Clinical summary copied from my initial office consultation 03/07/2013:   58 year old man who used to work in the [employer] business. He presented with a one-month history of right groin pain at age 10 in March of 2004. Bone scan showed some subtle abnormalities but was nondiagnostic done 01/12/2003. He underwent an MRI of the hip which showed a 3 cm expansile lesion in the right acetabulum. He was initially evaluated at [facility]. A CT guided biopsy was positive for plasma cells. He was referred to Dr. Truddie Coco. Bone marrow biopsy done 03/12/2003 showed only 5% plasma cells. He had a mild elevation of IgA immunoglobulin at 477 mg; IgG kappa on IFE percent. Urine was negative for free light chains. Serum beta 2 microglobulin 2.38.  He underwent initial radiation 20 gray 10 fractions between 329 and 04/04/2003. He did well until February 2005 when he had recurrent right hip pain. MRI done February 17, 2004 showed a recurrent large lesion measuring 2.6 x 7.7 cm. He received additional radiation 45 gray in 25 fractions between March 28 and 04/19/2004.  In October 2005, a soft tissue lesion was noted on the lateral aspect of his left leg. This was excised in December and found to be a plasmacytoma.[name] He received local radiation 30 gray in 10 fractions between January 4 and 12/28/2004.  Due to another local recurrence in the right hip, he underwent a total right hip replacement by Dr. Wynelle Link in February 2006.  In April 2006 he developed a soft tissue lesion on the left chest wall and again received radiation 30 gray in 10 fractions between July 17 and July 28 06.  He was started  on pulse dexamethasone in August 2006 and then a combination of Velcade, dexamethasone, and thalidomide beginning 09/08/2005. He received 5 cycles of treatment through 12/01/2005. He developed a steroid myopathy.  He was referred to [facility] where he underwent high dose intravenous melphalan chemotherapy 200 mg per meter squared with autologous stem cell support on 02/25/2006.  He achieved a durable remission subsequent to those treatments.  He last had a CT scan of the abdomen and pelvis on 07/14/2012 when he developed lower abdominal pain felt to be an episode of diverticulitis. There was significant artifact from the right hip replacement but no gross changes in the bone seen at that time.  Last metastatic bone survey done in June 2010.  As a consequence of his myeloma and its treatments, he has developed hypogammaglobulinemia and has had problems with recurrent sinopulmonary infections. He was started on a program of monthly intravenous immunoglobulin 4 years ago with excellent results and decrease in the frequency and severity of infections. Frequency of the IVIG infusions has been subsequently decreased to every 3 months.   Recent history: He continues to do well. No new areas of bone pain. Persistent distal neuropathy feet greater than hands. He did not tolerate liver code which caused blurred vision. He had no improvement with Neurontin. He was just started on Cymbalta about 2 weeks ago but so far is not tolerating the drug well with a sense of mental detachment from his surroundings. I suggested that he cut the dose in half from 39  to 20 mg after consultation with the doctor who prescribed medication.  Bone survey done in anticipation of today's visit on 02/20/2014 and I reviewed the images with him today. They're stable abnormalities in both right and left hips. Stable lesion involving the left eighth rib. No new blastic or lytic lesions.  CBC remained stable with a hemoglobin of  13.8, white count 5400 with normal differential, platelets 215,000. Serum immunoglobulins on every 3 month IVIG replacement IgG 760, IgA 333, IgM 49, all within low normal range. Free light chain analysis pending 24-hour urine total protein and IFE pending      Additional medical problems include:  Coronary artery disease. Initial coronary bypass surgery at [facility] in [place] in 1994. More recently, he had a myocardial infarction in June 2012 and required stent placement. Cardiologist is Dr. Bishop Limbo at [facility].  Hypertension  Hyperlipidemia  GERD  Peripheral neuropathy from previous chemotherapy  Episode of diverticulitis a year ago. Most recent colonoscopy by Dr. Carlean Purl of November 2013 showed severe diverticulosis in the left colon, 2 small sessile polyps removed from the sigmoid.   Medications: reviewed  Allergies: No Known Allergies  Review of Systems: Constitutional: No weight loss, no fever, no night sweats Hematology:  No bleeding or bruising ENT ROS: No sore throat Breast ROS:  Respiratory ROS: No cough or dyspnea Cardiovascular ROS: No chest pain or palpitations  Gastrointestinal ROS:  No abdominal pain or change in bowel habit  Genito-Urinary ROS: No urinary tract symptoms Musculoskeletal ROS: No muscle bone or joint pain Neurological ROS: Persistent painful distal neuropathy feet greater than hands Dermatological ROS:  Remaining ROS negative:   Physical Exam: Blood pressure 123/76, pulse 62, temperature 98.4 F (36.9 C), temperature source Oral, resp. rate 18, height 5' 9"  (1.753 m), weight 172 lb (78.019 kg), SpO2 100.00%. Wt Readings from Last 3 Encounters:  02/27/14 172 lb (78.019 kg)  08/01/13 174 lb 8 oz (79.153 kg)  03/07/13 173 lb 9.6 oz (78.744 kg)     General appearance: Well-nourished Caucasian man HENNT: Pharynx no erythema, exudate, mass, or ulcer. No thyromegaly or thyroid nodules Lymph nodes: No cervical,  supraclavicular, or axillary lymphadenopathy Breasts:  Lungs: Clear to auscultation, resonant to percussion throughout Heart: Regular rhythm, no murmur, no gallop, no rub, no click, no edema Abdomen: Soft, nontender, normal bowel sounds, no mass, no organomegaly Extremities: No edema, no calf tenderness Musculoskeletal: no joint deformities GU:  Vascular: Carotid pulses 2+, no bruits,  Neurologic: Alert, oriented, PERRLA,  cranial nerves grossly normal, motor strength 5 over 5, reflexes 1+ symmetric, upper body coordination normal, gait normal, mild decrease in vibration sensation over the fingertips by tuning fork exam feet not tested Skin: No rash or ecchymosis  Lab Results: CBC W/Diff    Component Value Date/Time   WBC 5.4 02/27/2014 1313   WBC 2.0* 11/10/2012 0513   RBC 4.75 02/27/2014 1313   RBC 3.96* 11/10/2012 0513   HGB 13.8 02/27/2014 1313   HGB 11.6* 11/10/2012 0513   HCT 41.9 02/27/2014 1313   HCT 34.9* 11/10/2012 0513   PLT 215 02/27/2014 1313   PLT 121* 11/10/2012 0513   MCV 88.2 02/27/2014 1313   MCV 88.1 11/10/2012 0513   MCH 29.0 02/27/2014 1313   MCH 29.3 11/10/2012 0513   MCHC 32.9 02/27/2014 1313   MCHC 33.2 11/10/2012 0513   RDW 13.5 02/27/2014 1313   RDW 13.6 11/10/2012 0513   LYMPHSABS 1.8 02/27/2014 1313   LYMPHSABS  1.2 11/09/2012 0440   MONOABS 0.6 02/27/2014 1313   MONOABS 0.6 11/09/2012 0440   EOSABS 0.0 02/27/2014 1313   EOSABS 0.1 11/09/2012 0440   BASOSABS 0.0 02/27/2014 1313   BASOSABS 0.0 11/09/2012 0440     Chemistry      Component Value Date/Time   NA 142 02/27/2014 1313   NA 141 11/10/2012 0513   K 4.3 02/27/2014 1313   K 4.3 11/10/2012 0513   CL 109* 03/14/2013 0835   CL 108 11/10/2012 0513   CO2 28 02/27/2014 1313   CO2 27 11/10/2012 0513   BUN 24.2 02/27/2014 1313   BUN 14 11/10/2012 0513   CREATININE 1.4* 02/27/2014 1313   CREATININE 1.29 11/10/2012 0513      Component Value Date/Time   CALCIUM 10.0 02/27/2014 1313   CALCIUM 8.4 11/10/2012 0513   ALKPHOS  31* 02/27/2014 1313   ALKPHOS 27* 08/09/2012 0757   AST 19 02/27/2014 1313   AST 15 08/09/2012 0757   ALT 16 02/27/2014 1313   ALT 12 08/09/2012 0757   BILITOT 0.57 02/27/2014 1313   BILITOT 0.4 08/09/2012 0757       Radiological Studies: Dg Bone Survey Met  02/20/2014   CLINICAL DATA:  F/U hx myeloma  EXAM: METASTATIC BONE SURVEY  COMPARISON:  DG BONE SURVEY MET dated 03/14/2013  FINDINGS: Lungs are clear. Cardiac silhouette is moderately enlarged. The previously described small lytic appearing focus within the greater trochanter of the right femur is stable. Stable area of lucency projects within the left acetabulum. A stable expansile lesion is appreciated involving the left eighth rib. No further lytic nor blastic foci are identified within the AP neck or axial skeleton. Areas of spondylosis are appreciated within the thoracic and cervical spine. There is no evidence of fracture nor dislocation. Patient is status post median sternotomy and coronary artery bypass grafting as well as total right hip arthroplasty. Osteoarthritic changes appreciated within the left hip.  IMPRESSION: Stable lucencies within the right greater trochanter and left acetabular regions. The expansile lesion involving the eighth rib on the left is also stable. Multilevel spondylosis is appreciated within the spine lungs are clear. No further blastic or lytic foci are identified.   Electronically Signed   By: Margaree Mackintosh M.D.   On: 02/20/2014 13:58    Impression:  #1. IgG kappa multiple myeloma characterized by recurrent plasmacytomas treated as outlined above  He remains in a durable remission now out over 8 years from high-dose IV melphalan with autologous stem cell support on no maintenance therapy.  Plan: Continue periodic followup.   #2. Hypogammaglobulinemia secondary to myeloma and treatment  He remains stable with resolution of recurrent sinopulmonary infections now on every three-month intravenous immunoglobulin  replacement. We will continue the every three-month replacement.  #3. Cardiovascular disease with prior coronary bypass surgery and now 2 years status post MI requiring coronary stenting   #4. Hypertension   #5. Hyperlipidemia   #6. Diverticulosis   #7. GERD   #8. Chronic renal insufficiency  Creatinine stable at 1.4  I will transition his care to Dr. Alvy Bimler #9. Chemotherapy-related peripheral neuropathy     CC: Patient Care Team: Tivis Ringer, MD as PCP - General (Internal Medicine)   Annia Belt, MD 3/11/201510:10 AM

## 2014-03-02 ENCOUNTER — Telehealth: Payer: Self-pay | Admitting: *Deleted

## 2014-03-02 NOTE — Telephone Encounter (Signed)
Spoke with patient and let him know that there is no significant change in antibody levels.  Kappa lambda light chain ratio remains in normal range.  No monoclonal proteins seen on immunoelectrophoresis. He appreciated the phone call and was delighted with the report.

## 2014-03-02 NOTE — Telephone Encounter (Signed)
Message copied by Ignacia Felling on Thu Mar 02, 2014  3:25 PM ------      Message from: Annia Belt      Created: Wed Mar 01, 2014  6:42 PM       Call pt: no significant change in antibody levels. Kappa lambda light chain ratio remains in normal range. No monoclonal proteins seen on immunoelectrophoresis ------

## 2014-03-06 ENCOUNTER — Telehealth: Payer: Self-pay | Admitting: *Deleted

## 2014-03-06 LAB — UIFE/LIGHT CHAINS/TP QN, 24-HR UR
Albumin, U: DETECTED
Free Kappa Lt Chains,Ur: 0.09 mg/dL — ABNORMAL LOW (ref 0.14–2.42)
Free Kappa/Lambda Ratio: 3 ratio (ref 2.04–10.37)
Free Lambda Excretion/Day: 0.57 mg/d
Free Lambda Lt Chains,Ur: <0.03 mg/dL (ref 0.02–0.67)
Free Lt Chn Excr Rate: 1.71 mg/d
Time: 24 hours
Total Protein, Urine-Ur/day: 55 mg/d (ref 10–140)
Total Protein, Urine: 2.9 mg/dL
Volume, Urine: 1900 mL

## 2014-03-06 NOTE — Telephone Encounter (Signed)
Notified pt that there was no antibody proteins in his urine collection.

## 2014-03-06 NOTE — Telephone Encounter (Signed)
Message copied by Jesse Fall on Mon Mar 06, 2014  4:35 PM ------      Message from: Annia Belt      Created: Mon Mar 06, 2014  2:16 PM       Call pt: no antibody proteins in urine collection ------

## 2014-05-08 ENCOUNTER — Other Ambulatory Visit (HOSPITAL_BASED_OUTPATIENT_CLINIC_OR_DEPARTMENT_OTHER): Payer: BC Managed Care – PPO

## 2014-05-08 ENCOUNTER — Other Ambulatory Visit: Payer: Self-pay | Admitting: Hematology and Oncology

## 2014-05-08 ENCOUNTER — Ambulatory Visit (HOSPITAL_BASED_OUTPATIENT_CLINIC_OR_DEPARTMENT_OTHER): Payer: BC Managed Care – PPO

## 2014-05-08 ENCOUNTER — Telehealth: Payer: Self-pay | Admitting: *Deleted

## 2014-05-08 ENCOUNTER — Telehealth: Payer: Self-pay | Admitting: Hematology and Oncology

## 2014-05-08 VITALS — BP 138/88 | HR 70 | Temp 98.9°F | Resp 17

## 2014-05-08 DIAGNOSIS — Z860100 Personal history of colon polyps, unspecified: Secondary | ICD-10-CM

## 2014-05-08 DIAGNOSIS — Z96649 Presence of unspecified artificial hip joint: Secondary | ICD-10-CM

## 2014-05-08 DIAGNOSIS — D809 Immunodeficiency with predominantly antibody defects, unspecified: Secondary | ICD-10-CM

## 2014-05-08 DIAGNOSIS — J309 Allergic rhinitis, unspecified: Secondary | ICD-10-CM

## 2014-05-08 DIAGNOSIS — C9001 Multiple myeloma in remission: Secondary | ICD-10-CM

## 2014-05-08 DIAGNOSIS — N179 Acute kidney failure, unspecified: Secondary | ICD-10-CM

## 2014-05-08 DIAGNOSIS — D801 Nonfamilial hypogammaglobulinemia: Secondary | ICD-10-CM

## 2014-05-08 DIAGNOSIS — I25810 Atherosclerosis of coronary artery bypass graft(s) without angina pectoris: Secondary | ICD-10-CM

## 2014-05-08 DIAGNOSIS — G4700 Insomnia, unspecified: Secondary | ICD-10-CM

## 2014-05-08 DIAGNOSIS — IMO0002 Reserved for concepts with insufficient information to code with codable children: Secondary | ICD-10-CM

## 2014-05-08 DIAGNOSIS — I10 Essential (primary) hypertension: Secondary | ICD-10-CM

## 2014-05-08 LAB — CBC WITH DIFFERENTIAL/PLATELET
BASO%: 0.4 % (ref 0.0–2.0)
Basophils Absolute: 0 10*3/uL (ref 0.0–0.1)
EOS ABS: 0.1 10*3/uL (ref 0.0–0.5)
EOS%: 0.7 % (ref 0.0–7.0)
HCT: 40.4 % (ref 38.4–49.9)
HGB: 13.2 g/dL (ref 13.0–17.1)
LYMPH%: 16.1 % (ref 14.0–49.0)
MCH: 28.7 pg (ref 27.2–33.4)
MCHC: 32.5 g/dL (ref 32.0–36.0)
MCV: 88.1 fL (ref 79.3–98.0)
MONO#: 0.6 10*3/uL (ref 0.1–0.9)
MONO%: 6.9 % (ref 0.0–14.0)
NEUT#: 6.3 10*3/uL (ref 1.5–6.5)
NEUT%: 75.9 % — ABNORMAL HIGH (ref 39.0–75.0)
PLATELETS: 221 10*3/uL (ref 140–400)
RBC: 4.59 10*6/uL (ref 4.20–5.82)
RDW: 16.4 % — ABNORMAL HIGH (ref 11.0–14.6)
WBC: 8.3 10*3/uL (ref 4.0–10.3)
lymph#: 1.3 10*3/uL (ref 0.9–3.3)

## 2014-05-08 LAB — COMPREHENSIVE METABOLIC PANEL (CC13)
ALK PHOS: 30 U/L — AB (ref 40–150)
ALT: 10 U/L (ref 0–55)
AST: 14 U/L (ref 5–34)
Albumin: 4 g/dL (ref 3.5–5.0)
Anion Gap: 12 mEq/L — ABNORMAL HIGH (ref 3–11)
BILIRUBIN TOTAL: 0.42 mg/dL (ref 0.20–1.20)
BUN: 22.9 mg/dL (ref 7.0–26.0)
CO2: 25 mEq/L (ref 22–29)
CREATININE: 1.9 mg/dL — AB (ref 0.7–1.3)
Calcium: 9.8 mg/dL (ref 8.4–10.4)
Chloride: 109 mEq/L (ref 98–109)
GLUCOSE: 118 mg/dL (ref 70–140)
Potassium: 4.4 mEq/L (ref 3.5–5.1)
SODIUM: 146 meq/L — AB (ref 136–145)
Total Protein: 7.1 g/dL (ref 6.4–8.3)

## 2014-05-08 MED ORDER — ACETAMINOPHEN 325 MG PO TABS
ORAL_TABLET | ORAL | Status: AC
  Filled 2014-05-08: qty 2

## 2014-05-08 MED ORDER — ACETAMINOPHEN 325 MG PO TABS
650.0000 mg | ORAL_TABLET | Freq: Four times a day (QID) | ORAL | Status: DC | PRN
  Administered 2014-05-08: 650 mg via ORAL

## 2014-05-08 MED ORDER — IMMUNE GLOBULIN (HUMAN) 20 GM/200ML IV SOLN
80.0000 g | Freq: Once | INTRAVENOUS | Status: AC
  Administered 2014-05-08: 80 g via INTRAVENOUS
  Filled 2014-05-08: qty 800

## 2014-05-08 MED ORDER — SODIUM CHLORIDE 0.9 % IV SOLN
Freq: Once | INTRAVENOUS | Status: AC
  Administered 2014-05-08: 09:00:00 via INTRAVENOUS

## 2014-05-08 MED ORDER — DIPHENHYDRAMINE HCL 25 MG PO TABS
25.0000 mg | ORAL_TABLET | Freq: Once | ORAL | Status: AC
  Administered 2014-05-08: 25 mg via ORAL
  Filled 2014-05-08: qty 1

## 2014-05-08 MED ORDER — DIPHENHYDRAMINE HCL 25 MG PO CAPS
ORAL_CAPSULE | ORAL | Status: AC
  Filled 2014-05-08: qty 1

## 2014-05-08 NOTE — Patient Instructions (Signed)
Immune Globulin Injection (Octagam) What is this medicine? IMMUNE GLOBULIN (im MUNE GLOB yoo lin) helps to prevent or reduce the severity of certain infections in patients who are at risk. This medicine is collected from the pooled blood of many donors. It is used to treat immune system problems, thrombocytopenia, and Kawasaki syndrome. This medicine may be used for other purposes; ask your health care provider or pharmacist if you have questions. COMMON BRAND NAME(S): Baygam, BIVIGAM, Carimune NF, Carimune, Flebogamma DIF , Flebogamma, Gamimune N, Gammagard S/D, Gammaked, Gammaplex, Gammar-P IV, Gamunex, Hizentra, Iveegam EN, Iveegam, Panglobulin NF, Panglobulin, Polygam S/D, Privigen , Sandoglobulin, Venoglobulin-S, Vigam, Vivaglobulin What should I tell my health care provider before I take this medicine? They need to know if you have any of these conditions: - diabetes - extremely low or no immune antibodies in the blood - heart disease - history of blood clots - hyperprolinemia - infection in the blood, sepsis - kidney disease - taking medicine that may change kidney function - ask your health care provider about your medicine - an unusual or allergic reaction to human immune globulin, albumin, maltose, sucrose, polysorbate 80, other medicines, foods, dyes, or preservatives - pregnant or trying to get pregnant - breast-feeding How should I use this medicine? This medicine is for injection into a muscle or infusion into a vein or skin. It is usually given by a health care professional in a hospital or clinic setting. In rare cases, some brands of this medicine might be given at home. You will be taught how to give this medicine. Use exactly as directed. Take your medicine at regular intervals. Do not take your medicine more often than directed. Talk to your pediatrician regarding the use of this medicine in children. Special care may be needed. Overdosage: If you think you have  taken too much of this medicine contact a poison control center or emergency room at once. NOTE: This medicine is only for you. Do not share this medicine with others. What if I miss a dose? It is important not to miss your dose. Call your doctor or health care professional if you are unable to keep an appointment. If you give yourself the medicine and you miss a dose, take it as soon as you can. If it is almost time for your next dose, take only that dose. Do not take double or extra doses. What may interact with this medicine? -aspirin and aspirin-like medicines -cisplatin -cyclosporine -medicines for infection like acyclovir, adefovir, amphotericin B, bacitracin, cidofovir, foscarnet, ganciclovir, gentamicin, pentamidine, vancomycin -NSAIDS, medicines for pain and inflammation, like ibuprofen or naproxen -pamidronate -vaccines -zoledronic acid This list may not describe all possible interactions. Give your health care provider a list of all the medicines, herbs, non-prescription drugs, or dietary supplements you use. Also tell them if you smoke, drink alcohol, or use illegal drugs. Some items may interact with your medicine. What should I watch for while using this medicine? Your condition will be monitored carefully while you are receiving this medicine. This medicine is made from pooled blood donations of many different people. It may be possible to pass an infection in this medicine. However, the donors are screened for infections and all products are tested for HIV and hepatitis. The medicine is treated to kill most or all bacteria and viruses. Talk to your doctor about the risks and benefits of this medicine. Do not have vaccinations for at least 14 days before, or until at least 3 months after receiving this medicine.  What side effects may I notice from receiving this medicine? Side effects that you should report to your doctor or health care professional as soon as possible: -allergic  reactions like skin rash, itching or hives, swelling of the face, lips, or tongue -breathing problems -chest pain or tightness -fever, chills -headache with nausea, vomiting -neck pain or difficulty moving neck -pain when moving eyes -pain, swelling, warmth in the leg -problems with balance, talking, walking -sudden weight gain -swelling of the ankles, feet, hands -trouble passing urine or change in the amount of urine Side effects that usually do not require medical attention (report to your doctor or health care professional if they continue or are bothersome): -dizzy, drowsy -flushing -increased sweating -leg cramps -muscle aches and pains -pain at site where injected This list may not describe all possible side effects. Call your doctor for medical advice about side effects. You may report side effects to FDA at 1-800-FDA-1088. Where should I keep my medicine? Keep out of the reach of children. This drug is usually given in a hospital or clinic and will not be stored at home. In rare cases, some brands of this medicine may be given at home. If you are using this medicine at home, you will be instructed on how to store this medicine. Throw away any unused medicine after the expiration date on the label. NOTE: This sheet is a summary. It may not cover all possible information. If you have questions about this medicine, talk to your doctor, pharmacist, or health care provider.  2014, Elsevier/Gold Standard. (2009-02-28 11:44:49)

## 2014-05-08 NOTE — Telephone Encounter (Signed)
S/w pt in infusion room.  Instructed to drink at least 2 liters non caffeine fluid per day d/t elevated Creatinine level.  Pt verbalized understanding.  Informed pt of appt to be made for repeat labs and see Dr. Alvy Bimler next week on 5/26. He verbalized understanding.

## 2014-05-08 NOTE — Telephone Encounter (Signed)
gv adn printed appt sched and avs for pt for May and Aug

## 2014-05-10 LAB — IMMUNOFIXATION ELECTROPHORESIS
IGA: 320 mg/dL (ref 68–379)
IGM, SERUM: 46 mg/dL (ref 41–251)
IgG (Immunoglobin G), Serum: 764 mg/dL (ref 650–1600)
TOTAL PROTEIN, SERUM ELECTROPHOR: 6.8 g/dL (ref 6.0–8.3)

## 2014-05-10 LAB — KAPPA/LAMBDA LIGHT CHAINS
Kappa free light chain: 2.73 mg/dL — ABNORMAL HIGH (ref 0.33–1.94)
Kappa:Lambda Ratio: 1.3 (ref 0.26–1.65)
Lambda Free Lght Chn: 2.1 mg/dL (ref 0.57–2.63)

## 2014-05-11 ENCOUNTER — Ambulatory Visit (HOSPITAL_COMMUNITY)
Admission: RE | Admit: 2014-05-11 | Discharge: 2014-05-11 | Disposition: A | Discharge status: Home / Self Care | Payer: BC Managed Care – PPO | Source: Ambulatory Visit | Attending: Hematology and Oncology | Admitting: Hematology and Oncology

## 2014-05-11 DIAGNOSIS — C9001 Multiple myeloma in remission: Secondary | ICD-10-CM | POA: Insufficient documentation

## 2014-05-11 DIAGNOSIS — N179 Acute kidney failure, unspecified: Secondary | ICD-10-CM | POA: Insufficient documentation

## 2014-05-16 ENCOUNTER — Telehealth: Payer: Self-pay | Admitting: Hematology and Oncology

## 2014-05-16 ENCOUNTER — Telehealth: Payer: Self-pay | Admitting: Oncology

## 2014-05-16 ENCOUNTER — Other Ambulatory Visit (HOSPITAL_BASED_OUTPATIENT_CLINIC_OR_DEPARTMENT_OTHER): Payer: BC Managed Care – PPO

## 2014-05-16 ENCOUNTER — Ambulatory Visit (HOSPITAL_BASED_OUTPATIENT_CLINIC_OR_DEPARTMENT_OTHER): Payer: BC Managed Care – PPO | Admitting: Hematology and Oncology

## 2014-05-16 ENCOUNTER — Telehealth: Payer: Self-pay | Admitting: *Deleted

## 2014-05-16 ENCOUNTER — Encounter: Payer: Self-pay | Admitting: Hematology and Oncology

## 2014-05-16 VITALS — BP 154/86 | HR 60 | Temp 97.7°F | Resp 18 | Wt 177.1 lb

## 2014-05-16 DIAGNOSIS — N189 Chronic kidney disease, unspecified: Secondary | ICD-10-CM

## 2014-05-16 DIAGNOSIS — R799 Abnormal finding of blood chemistry, unspecified: Secondary | ICD-10-CM

## 2014-05-16 DIAGNOSIS — G620 Drug-induced polyneuropathy: Secondary | ICD-10-CM

## 2014-05-16 DIAGNOSIS — IMO0002 Reserved for concepts with insufficient information to code with codable children: Secondary | ICD-10-CM

## 2014-05-16 DIAGNOSIS — I25810 Atherosclerosis of coronary artery bypass graft(s) without angina pectoris: Secondary | ICD-10-CM

## 2014-05-16 DIAGNOSIS — C9001 Multiple myeloma in remission: Secondary | ICD-10-CM

## 2014-05-16 DIAGNOSIS — D801 Nonfamilial hypogammaglobulinemia: Secondary | ICD-10-CM

## 2014-05-16 DIAGNOSIS — N179 Acute kidney failure, unspecified: Secondary | ICD-10-CM | POA: Insufficient documentation

## 2014-05-16 DIAGNOSIS — D839 Common variable immunodeficiency, unspecified: Secondary | ICD-10-CM

## 2014-05-16 DIAGNOSIS — R7989 Other specified abnormal findings of blood chemistry: Secondary | ICD-10-CM

## 2014-05-16 DIAGNOSIS — I10 Essential (primary) hypertension: Secondary | ICD-10-CM

## 2014-05-16 LAB — COMPREHENSIVE METABOLIC PANEL (CC13)
ALT: 19 U/L (ref 0–55)
AST: 24 U/L (ref 5–34)
Albumin: 4 g/dL (ref 3.5–5.0)
Alkaline Phosphatase: 31 U/L — ABNORMAL LOW (ref 40–150)
Anion Gap: 11 mEq/L (ref 3–11)
BILIRUBIN TOTAL: 0.88 mg/dL (ref 0.20–1.20)
BUN: 17.7 mg/dL (ref 7.0–26.0)
CALCIUM: 9.6 mg/dL (ref 8.4–10.4)
CHLORIDE: 108 meq/L (ref 98–109)
CO2: 24 mEq/L (ref 22–29)
Creatinine: 1.3 mg/dL (ref 0.7–1.3)
Glucose: 89 mg/dl (ref 70–140)
Potassium: 4.1 mEq/L (ref 3.5–5.1)
Sodium: 142 mEq/L (ref 136–145)
Total Protein: 8 g/dL (ref 6.4–8.3)

## 2014-05-16 NOTE — Progress Notes (Signed)
[facility] progress notes  Patient Care Team: Tivis Ringer, MD as PCP - General (Internal Medicine)  CHIEF COMPLAINTS/PURPOSE OF VISIT:  Multiple myeloma, no evidence of disease  HISTORY OF PRESENTING ILLNESS:  [name] 58 y.o. male was transferred to my care after his prior physician has left.  I reviewed the patient's records extensive and collaborated the history with the patient. Summary of his history is as follows: He presented with a one-month history of right groin pain at age 56 in March of 2004. Bone scan showed some subtle abnormalities but was nondiagnostic done 01/12/2003. He underwent an MRI of the hip which showed a 3 cm expansile lesion in the right acetabulum. He was initially evaluated at [facility]. A CT guided biopsy was positive for plasma cells. He was referred to Dr. Truddie Coco. Bone marrow biopsy done 03/12/2003 showed only 5% plasma cells. He had a mild elevation of IgA immunoglobulin at 477 mg; IgG kappa on IFE percent. Urine was negative for free light chains. Serum beta 2 microglobulin 2.38.  He underwent initial radiation 20 gray 10 fractions between 329 and 04/04/2003. He did well until February 2005 when he had recurrent right hip pain. MRI done February 17, 2004 showed a recurrent large lesion measuring 2.6 x 7.7 cm. He received additional radiation 45 gray in 25 fractions between March 28 and 04/19/2004.  In October 2005, a soft tissue lesion was noted on the lateral aspect of his left leg. This was excised in December and found to be a plasmacytoma.[name] He received local radiation 30 gray in 10 fractions between January 4 and 12/28/2004.  Due to another local recurrence in the right hip, he underwent a total right hip replacement by Dr. Wynelle Link in February 2006.  In April 2006 he developed a soft tissue lesion on the left chest wall and again received radiation 30 gray in 10 fractions between July 17 and July 28 06.  He was  started on pulse dexamethasone in August 2006 and then a combination of Velcade, dexamethasone, and thalidomide beginning 09/08/2005. He received 5 cycles of treatment through 12/01/2005. He developed a steroid myopathy.  He was referred to [facility] where he underwent high dose intravenous melphalan chemotherapy 200 mg per meter squared with autologous stem cell support on 02/25/2006.  He achieved a durable remission subsequent to those treatments.  As a consequence of his myeloma and its treatments, he has developed hypogammaglobulinemia and has had problems with recurrent sinopulmonary infections. He was started on a program of monthly intravenous immunoglobulin 4 years ago with excellent results and decrease in the frequency and severity of infections. Frequency of the IVIG infusions has been subsequently decreased to every 3 months.   Since he was last seen here, he still had persistent peripheral neuropathy.  He denies any bone pain. He developed signs and symptoms of serum sickness recently from IVIG. Denies recent infection.  MEDICAL HISTORY:  Past Medical History  Diagnosis Date  . multiple myeloma dx'd 2005    chemo/xrt comp 2006  . Plasmocytoma dx'd 2007    IVIG treatment in progress  . CAD (coronary artery disease)   . Chronic renal insufficiency   . HTN (hypertension)   . Hypercholesteremia   . Diverticulitis   . Gout   . GERD (gastroesophageal reflux disease)   . Hx of stem cell transplant 02/2006  . Peripheral neuropathy     chronic  . B12 deficiency   . Rosacea   .  Recurrent infections     after stem cell transplant - on Iv IgG  . Personal history of colonic polyps 11/01/2012    2006 - 6 adenomas max 6 mm ([facility]) 2009 - inflammatory polyps x 2 11/01/2012 sigmoid 10 mm and rectal 8 mm polyps - retention polyps    . MI (myocardial infarction)     MI June 2012  . Hypogammaglobulinemia, acquired 01/21/2013    Due to high doe chemo for myeloma  . Peripheral  neuropathy, secondary to drugs or chemicals 02/27/2014    Due to chemotherapy given for myeloma    SURGICAL HISTORY: Past Surgical History  Procedure Laterality Date  . Coronary artery bypass graft  08/19/1993    [facility] in [place], [place]  . Colonoscopy w/ polypectomy    . Lasik  10/2009    Dr. Gershon Crane  . Total hip arthroplasty  02/03/2005    right  . Stent placement for hydronephrosis  11/2004    Dr. Diona Fanti  . Hernia repair  09/2010    left, Dr. Donne Hazel  . Coronary angioplasty with stent placement  05/2011    Dr. Quillian Quince in [facility]  . Flexible sigmoidoscopy  11/09/2012    Procedure: FLEXIBLE SIGMOIDOSCOPY;  Surgeon: Milus Banister, MD;  Location: [facility] ENDOSCOPY;  Service: Endoscopy;  Laterality: N/A;    SOCIAL HISTORY: History   Social History  . Marital Status: Married    Spouse Name: N/A    Number of Children: 1  . Years of Education: N/A   Occupational History  . disabled    Social History Main Topics  . Smoking status: Former Smoker    Quit date: 12/22/1992  . Smokeless tobacco: Never Used  . Alcohol Use: 0.0 oz/week     Comment: 1 glass of wine/ day  . Drug Use: No  . Sexual Activity: No   Other Topics Concern  . Not on file   Social History Narrative   Patient is married with one son   On disability   Does use alcohol, former smoker, 2 caffeinated beverages daily   Updated 09/28/2012          FAMILY HISTORY: Family History  Problem Relation Age of Onset  . Heart attack Father 46    x 2  . Colon cancer Mother   . Colon polyps Mother     ALLERGIES:  has No Known Allergies.  MEDICATIONS:  Current Outpatient Prescriptions  Medication Sig Dispense Refill  . amLODipine (NORVASC) 10 MG tablet Take 10 mg by mouth daily.      [name] aspirin 81 MG tablet Take 81 mg by mouth daily.      [name] atenolol (TENORMIN) 100 MG tablet Take 100 mg by mouth daily.      [name] atorvastatin (LIPITOR) 80 MG tablet Take 80 mg by mouth daily.      . clopidogrel  (PLAVIX) 75 MG tablet Take 1 tablet (75 mg total) by mouth daily.  30 tablet  0  . DULoxetine (CYMBALTA) 60 MG capsule Take 60 mg by mouth daily.      . fenofibrate (LOFIBRA) 160 MG tablet Take 160 mg by mouth daily.      . fluticasone (FLONASE) 50 MCG/ACT nasal spray Place 2 sprays into the nose daily.      [name] loratadine (CLARITIN) 10 MG tablet Take 10 mg by mouth daily.      . NON FORMULARY IV Immunoglobulin quarterly      . omeprazole (PRILOSEC) 20 MG capsule  Take 20 mg by mouth daily.      [name] zolpidem (AMBIEN) 10 MG tablet Take 10 mg by mouth at bedtime as needed.       No current facility-administered medications for this visit.    REVIEW OF SYSTEMS:   Constitutional: Denies fevers, chills or abnormal night sweats Eyes: Denies blurriness of vision, double vision or watery eyes Ears, nose, mouth, throat, and face: Denies mucositis or sore throat Respiratory: Denies cough, dyspnea or wheezes Cardiovascular: Denies palpitation, chest discomfort or lower extremity swelling Gastrointestinal:  Denies nausea, heartburn or change in bowel habits Skin: Denies abnormal skin rashes Lymphatics: Denies new lymphadenopathy or easy bruising Behavioral/Psych: Mood is stable, no new changes  All other systems were reviewed with the patient and are negative.  PHYSICAL EXAMINATION: ECOG PERFORMANCE STATUS: 0 - Asymptomatic  Filed Vitals:   05/16/14 1237  BP: 154/86  Pulse: 60  Temp: 97.7 F (36.5 C)  Resp: 18   Filed Weights   05/16/14 1237  Weight: 177 lb 1 oz (80.315 kg)    GENERAL:alert, no distress and comfortable SKIN: skin color, texture, turgor are normal, no rashes or significant lesions EYES: normal, conjunctiva are pink and non-injected, sclera clear OROPHARYNX:no exudate, normal lips, buccal mucosa, and tongue  NECK: supple, thyroid normal size, non-tender, without nodularity LYMPH:  no palpable lymphadenopathy in the cervical, axillary or inguinal LUNGS: clear to  auscultation and percussion with normal breathing effort HEART: regular rate & rhythm and no murmurs without lower extremity edema ABDOMEN:abdomen soft, non-tender and normal bowel sounds Musculoskeletal:no cyanosis of digits and no clubbing  PSYCH: alert & oriented x 3 with fluent speech NEURO: no focal motor/sensory deficits  LABORATORY DATA:  I have reviewed the data as listed Lab Results  Component Value Date   WBC 8.3 05/08/2014   HGB 13.2 05/08/2014   HCT 40.4 05/08/2014   MCV 88.1 05/08/2014   PLT 221 05/08/2014    Recent Labs  02/27/14 1313 05/08/14 0833 05/16/14 1216  NA 142 146* 142  K 4.3 4.4 4.1  CO2 28 25 24   GLUCOSE 82 118 89  BUN 24.2 22.9 17.7  CREATININE 1.4* 1.9* 1.3  CALCIUM 10.0 9.8 9.6  PROT 8.5* 7.1 8.0  ALBUMIN 4.3 4.0 4.0  AST 19 14 24   ALT 16 10 19   ALKPHOS 31* 30* 31*  BILITOT 0.57 0.42 0.88    RADIOGRAPHIC STUDIES: US Renal  05/11/2014   CLINICAL DATA:  Multiple myeloma in remission, acute renal failure  EXAM: RENAL/URINARY TRACT ULTRASOUND COMPLETE  COMPARISON:  None.  FINDINGS: Right Kidney:  Length: 10 cm. Mild to moderate diffuse cortical thinning. Normal echogenicity with no hydronephrosis.  Left Kidney:  Length: 12 cm. Echogenicity within normal limits. No mass or hydronephrosis visualized.  Bladder:  Appears normal for degree of bladder distention.  IMPRESSION: Right kidney shows cortical thinning and asymmetry in size when compared to the left side. Etiology is uncertain. A vascular cause cannot be excluded.   Electronically Signed   By: Skipper Cliche M.D.   On: 05/11/2014 15:20    ASSESSMENT & PLAN:  MULTIPLE MYELOMA, IN REMISSION Clinically, no evidence of recurrence. He has mild acute renal failure recently which resolved after increasing oral fluid intake. I recommend continue observation with surveillance blood work, history and physical examination.  Peripheral neuropathy, secondary to drugs or chemicals The patient is not taking  any medication for this as he felt only medications are not helping his symptoms but caused significant side effects.  Hypogammaglobulinemia, acquired His last IVIG infusion in May of 5615 was complicated by serum sickness. The patient describes fevers, chills, mild myalgia with feeling unwell for several days after treatment. After a prolonged discussion, we are in agreement to discontinue IVIG treatment.  Acute on chronic renal failure This is improved with hydration. Ultrasound kidney show reduced cortical thickness on the right kidney. Nephrology consult was placed in May 2015.  CORONARY ATHEROSCLEROSIS OF ARTERY BYPASS GRAFT This is stable. The patient is currently on aspirin, blood pressure medication as well as Lipitor. He has good performance status without cardiovascular limitation.   HYPERTENSION His blood pressure is to ultimately controlled. I recommend nephrology consultation.    Orders Placed This Encounter  Procedures  . Comprehensive metabolic panel    Standing Status: Future     Number of Occurrences:      Standing Expiration Date: 05/16/2015  . CBC with Differential    Standing Status: Future     Number of Occurrences:      Standing Expiration Date: 05/16/2015  . Lactate dehydrogenase    Standing Status: Future     Number of Occurrences:      Standing Expiration Date: 05/16/2015  . SPEP & IFE with QIG    Standing Status: Future     Number of Occurrences:      Standing Expiration Date: 05/16/2015  . Kappa/lambda light chains    Standing Status: Future     Number of Occurrences:      Standing Expiration Date: 05/16/2015  . Beta 2 microglobulin, serum    Standing Status: Future     Number of Occurrences:      Standing Expiration Date: 05/17/2015  . Ambulatory referral to Nephrology    Referral Priority:  Routine    Referral Type:  Consultation    Referral Reason:  Specialty Services Required    Requested Specialty:  Nephrology    Number of Visits Requested:  1     All questions were answered. The patient knows to call the clinic with any problems, questions or concerns. I spent 25 minutes counseling the patient face to face. The total time spent in the appointment was 30 minutes and more than 50% was on counseling.     Heath Lark, MD 05/16/2014 7:59 PM

## 2014-05-16 NOTE — Assessment & Plan Note (Signed)
His blood pressure is to ultimately controlled. I recommend nephrology consultation.

## 2014-05-16 NOTE — Assessment & Plan Note (Signed)
Clinically, no evidence of recurrence. He has mild acute renal failure recently which resolved after increasing oral fluid intake. I recommend continue observation with surveillance blood work, history and physical examination.

## 2014-05-16 NOTE — Assessment & Plan Note (Signed)
The patient is not taking any medication for this as he felt only medications are not helping his symptoms but caused significant side effects.

## 2014-05-16 NOTE — Assessment & Plan Note (Signed)
His last IVIG infusion in May of 8242 was complicated by serum sickness. The patient describes fevers, chills, mild myalgia with feeling unwell for several days after treatment. After a prolonged discussion, we are in agreement to discontinue IVIG treatment.

## 2014-05-16 NOTE — Assessment & Plan Note (Addendum)
This is stable. The patient is currently on aspirin, blood pressure medication as well as Lipitor. He has good performance status without cardiovascular limitation.

## 2014-05-16 NOTE — Telephone Encounter (Signed)
Message copied by Randolm Idol on Tue May 16, 2014  2:29 PM ------      Message from: Cathlean Cower      Created: Tue May 16, 2014  2:20 PM      Regarding: FW: repeat creatinine                   ----- Message -----         From: Heath Lark, MD         Sent: 05/16/2014   1:21 PM           To: Cathlean Cower, RN      Subject: repeat creatinine                                        Much improved      No change in recommendations, encourage PO fluid intake and Neph consult      ----- Message -----         From: [facility]         Sent: 05/16/2014   1:14 PM           To: Heath Lark, MD                   ------

## 2014-05-16 NOTE — Telephone Encounter (Signed)
Faxed pt medical records to [facility] °

## 2014-05-16 NOTE — Assessment & Plan Note (Signed)
This is improved with hydration. Ultrasound kidney show reduced cortical thickness on the right kidney. Nephrology consult was placed in May 2015.

## 2014-05-16 NOTE — Telephone Encounter (Signed)
per pof to sch appt and sch Nephrology-will give paper to  Cameo to fill  out-adv pt that  That office  will call and set the appt. Pt understood-pt has dates & times of app

## 2014-05-17 ENCOUNTER — Other Ambulatory Visit: Payer: Self-pay | Admitting: Dermatology

## 2014-05-17 NOTE — Telephone Encounter (Signed)
Spoke with patient, labs much improved, increase po intake and f/u with nephrology consult.

## 2014-05-22 ENCOUNTER — Telehealth: Payer: Self-pay | Admitting: Hematology and Oncology

## 2014-05-22 NOTE — Telephone Encounter (Signed)
returned pt call and advised that I sent referral to Medical records and lvm for [facility] for appt

## 2014-05-23 ENCOUNTER — Telehealth: Payer: Self-pay | Admitting: Hematology and Oncology

## 2014-05-23 NOTE — Telephone Encounter (Signed)
Faxed pt medical records to [facility] °

## 2014-05-25 ENCOUNTER — Encounter: Payer: Self-pay | Admitting: *Deleted

## 2014-05-25 NOTE — Progress Notes (Signed)
Note received from [facility] [phone].  They received referral and ranked pt a 3 on scale 1 to 4 priority.  It may be 3 to 4 months before pt can be seen by Nephrologist.  Message to Dr. Alvy Bimler.

## 2014-05-30 ENCOUNTER — Telehealth: Payer: Self-pay | Admitting: Oncology

## 2014-05-30 NOTE — Telephone Encounter (Signed)
Spoke with Horris Latino from Dr. Hart Robinsons office, they have pts medical records and slides.

## 2014-06-19 ENCOUNTER — Other Ambulatory Visit: Payer: Self-pay | Admitting: Dermatology

## 2014-08-07 ENCOUNTER — Ambulatory Visit: Payer: BC Managed Care – PPO | Admitting: Hematology and Oncology

## 2014-08-07 ENCOUNTER — Ambulatory Visit: Payer: BC Managed Care – PPO

## 2014-08-07 ENCOUNTER — Other Ambulatory Visit (HOSPITAL_BASED_OUTPATIENT_CLINIC_OR_DEPARTMENT_OTHER): Payer: BC Managed Care – PPO

## 2014-08-07 DIAGNOSIS — D801 Nonfamilial hypogammaglobulinemia: Secondary | ICD-10-CM

## 2014-08-07 DIAGNOSIS — D839 Common variable immunodeficiency, unspecified: Secondary | ICD-10-CM

## 2014-08-07 DIAGNOSIS — C9001 Multiple myeloma in remission: Secondary | ICD-10-CM

## 2014-08-07 LAB — CBC WITH DIFFERENTIAL/PLATELET
BASO%: 0.5 % (ref 0.0–2.0)
Basophils Absolute: 0 10*3/uL (ref 0.0–0.1)
EOS%: 0.8 % (ref 0.0–7.0)
Eosinophils Absolute: 0 10*3/uL (ref 0.0–0.5)
HCT: 39.7 % (ref 38.4–49.9)
HEMOGLOBIN: 13 g/dL (ref 13.0–17.1)
LYMPH#: 1.3 10*3/uL (ref 0.9–3.3)
LYMPH%: 34.4 % (ref 14.0–49.0)
MCH: 28 pg (ref 27.2–33.4)
MCHC: 32.7 g/dL (ref 32.0–36.0)
MCV: 85.4 fL (ref 79.3–98.0)
MONO#: 0.6 10*3/uL (ref 0.1–0.9)
MONO%: 14.1 % — AB (ref 0.0–14.0)
NEUT#: 2 10*3/uL (ref 1.5–6.5)
NEUT%: 50.2 % (ref 39.0–75.0)
Platelets: 177 10*3/uL (ref 140–400)
RBC: 4.65 10*6/uL (ref 4.20–5.82)
RDW: 14.8 % — AB (ref 11.0–14.6)
WBC: 3.9 10*3/uL — ABNORMAL LOW (ref 4.0–10.3)

## 2014-08-07 LAB — COMPREHENSIVE METABOLIC PANEL (CC13)
ALBUMIN: 4.3 g/dL (ref 3.5–5.0)
ALT: 28 U/L (ref 0–55)
ANION GAP: 11 meq/L (ref 3–11)
AST: 48 U/L — AB (ref 5–34)
Alkaline Phosphatase: 28 U/L — ABNORMAL LOW (ref 40–150)
BUN: 31.5 mg/dL — ABNORMAL HIGH (ref 7.0–26.0)
CALCIUM: 10.2 mg/dL (ref 8.4–10.4)
CO2: 26 meq/L (ref 22–29)
CREATININE: 1.6 mg/dL — AB (ref 0.7–1.3)
Chloride: 102 mEq/L (ref 98–109)
Glucose: 87 mg/dl (ref 70–140)
POTASSIUM: 4.7 meq/L (ref 3.5–5.1)
SODIUM: 138 meq/L (ref 136–145)
TOTAL PROTEIN: 7.5 g/dL (ref 6.4–8.3)
Total Bilirubin: 0.75 mg/dL (ref 0.20–1.20)

## 2014-08-07 LAB — LACTATE DEHYDROGENASE (CC13): LDH: 194 U/L (ref 125–245)

## 2014-08-09 LAB — SPEP & IFE WITH QIG
ALBUMIN ELP: 61.7 % (ref 55.8–66.1)
Alpha-1-Globulin: 3.8 % (ref 2.9–4.9)
Alpha-2-Globulin: 7.8 % (ref 7.1–11.8)
BETA 2: 5.9 % (ref 3.2–6.5)
BETA GLOBULIN: 9.2 % — AB (ref 4.7–7.2)
Gamma Globulin: 11.6 % (ref 11.1–18.8)
IGG (IMMUNOGLOBIN G), SERUM: 801 mg/dL (ref 650–1600)
IgA: 323 mg/dL (ref 68–379)
IgM, Serum: 48 mg/dL (ref 41–251)
Total Protein, Serum Electrophoresis: 7.4 g/dL (ref 6.0–8.3)

## 2014-08-09 LAB — KAPPA/LAMBDA LIGHT CHAINS
KAPPA LAMBDA RATIO: 1.23 (ref 0.26–1.65)
Kappa free light chain: 2.16 mg/dL — ABNORMAL HIGH (ref 0.33–1.94)
LAMBDA FREE LGHT CHN: 1.75 mg/dL (ref 0.57–2.63)

## 2014-08-09 LAB — BETA 2 MICROGLOBULIN, SERUM: BETA 2 MICROGLOBULIN: 3.15 mg/L — AB (ref <=2.51)

## 2014-08-14 ENCOUNTER — Ambulatory Visit (HOSPITAL_BASED_OUTPATIENT_CLINIC_OR_DEPARTMENT_OTHER): Payer: BC Managed Care – PPO | Admitting: Hematology and Oncology

## 2014-08-14 ENCOUNTER — Encounter: Payer: Self-pay | Admitting: Hematology and Oncology

## 2014-08-14 VITALS — BP 144/79 | HR 59 | Temp 98.1°F | Resp 18 | Ht 69.0 in | Wt 173.8 lb

## 2014-08-14 DIAGNOSIS — D801 Nonfamilial hypogammaglobulinemia: Secondary | ICD-10-CM

## 2014-08-14 DIAGNOSIS — C9001 Multiple myeloma in remission: Secondary | ICD-10-CM

## 2014-08-14 DIAGNOSIS — N189 Chronic kidney disease, unspecified: Secondary | ICD-10-CM

## 2014-08-14 DIAGNOSIS — D839 Common variable immunodeficiency, unspecified: Secondary | ICD-10-CM

## 2014-08-14 DIAGNOSIS — N179 Acute kidney failure, unspecified: Secondary | ICD-10-CM

## 2014-08-14 NOTE — Assessment & Plan Note (Signed)
The patient remain in remission. He has been 8 years since his transplant. I recommend history, physical examination, blood work, 24-hour urine collection and skeletal survey to be done a week before his return visit next year. In the meantime, he will continue close followup with his primary care provider. I recommend yearly influenza vaccination.

## 2014-08-14 NOTE — Assessment & Plan Note (Signed)
His last IVIG infusion in May of 3358 was complicated by serum sickness. The patient describes fevers, chills, mild myalgia with feeling unwell for several days after treatment. After a prolonged discussion, we are in agreement to discontinue IVIG since May 2015. He has no recent infection. His most recent immunoglobulin level was satisfactory.

## 2014-08-14 NOTE — Assessment & Plan Note (Signed)
His blood work show mild dehydration which I suspect is due to his fasting state. I recommend close monitoring of blood pressure and nephrology followup. I recommend increase oral fluid hydration.

## 2014-08-14 NOTE — Progress Notes (Signed)
[place] OFFICE PROGRESS NOTE  Patient Care Team: Tivis Ringer, MD as PCP - General (Internal Medicine)  SUMMARY OF ONCOLOGIC HISTORY: He presented with a one-month history of right groin pain at age 58 in March of 2004. Bone scan showed some subtle abnormalities but was nondiagnostic done 01/12/2003. He underwent an MRI of the hip which showed a 3 cm expansile lesion in the right acetabulum. He was initially evaluated at [facility]. A CT guided biopsy was positive for plasma cells. He was referred to Dr. Truddie Coco. Bone marrow biopsy done 03/12/2003 showed only 5% plasma cells. He had a mild elevation of IgA immunoglobulin at 477 mg; IgG kappa on IFE percent. Urine was negative for free light chains. Serum beta 2 microglobulin 2.38.  He underwent initial radiation 20 gray 10 fractions between 329 and 04/04/2003. He did well until February 2005 when he had recurrent right hip pain. MRI done February 17, 2004 showed a recurrent large lesion measuring 2.6 x 7.7 cm. He received additional radiation 45 gray in 25 fractions between March 28 and 04/19/2004.  In October 2005, a soft tissue lesion was noted on the lateral aspect of his left leg. This was excised in December and found to be a plasmacytoma.[name] He received local radiation 30 gray in 10 fractions between January 4 and 12/28/2004.  Due to another local recurrence in the right hip, he underwent a total right hip replacement by Dr. Wynelle Link in February 2006.  In April 2006 he developed a soft tissue lesion on the left chest wall and again received radiation 30 gray in 10 fractions between July 17 and July 28 06.  He was started on pulse dexamethasone in August 2006 and then a combination of Velcade, dexamethasone, and thalidomide beginning 09/08/2005. He received 5 cycles of treatment through 12/01/2005. He developed a steroid myopathy.  He was referred to [facility] where he underwent high dose intravenous melphalan  chemotherapy 200 mg per meter squared with autologous stem cell support on 02/25/2006.  He achieved a durable remission subsequent to those treatments.  As a consequence of his myeloma and its treatments, he has developed hypogammaglobulinemia and has had problems with recurrent sinopulmonary infections. He was started on a program of monthly intravenous immunoglobulin 4 years ago with excellent results and decrease in the frequency and severity of infections. Frequency of the IVIG infusions has been subsequently decreased to every 3 months, discontinued in May 2015.   INTERVAL HISTORY: Please see below for problem oriented charting. He denies recent infection. Denies new bone pain.  REVIEW OF SYSTEMS:   Constitutional: Denies fevers, chills or abnormal weight loss Eyes: Denies blurriness of vision Ears, nose, mouth, throat, and face: Denies mucositis or sore throat Respiratory: Denies cough, dyspnea or wheezes Cardiovascular: Denies palpitation, chest discomfort or lower extremity swelling Gastrointestinal:  Denies nausea, heartburn or change in bowel habits Skin: Denies abnormal skin rashes Lymphatics: Denies new lymphadenopathy or easy bruising Neurological:Denies numbness, tingling or new weaknesses Behavioral/Psych: Mood is stable, no new changes  All other systems were reviewed with the patient and are negative.  I have reviewed the past medical history, past surgical history, social history and family history with the patient and they are unchanged from previous note.  ALLERGIES:  has No Known Allergies.  MEDICATIONS:  Current Outpatient Prescriptions  Medication Sig Dispense Refill  . cholecalciferol (VITAMIN D) 1000 UNITS tablet Take 2,000 Units by mouth daily.      . vitamin B-12 (CYANOCOBALAMIN)  100 MCG tablet Take 100 mcg by mouth daily.      [name] amLODipine (NORVASC) 10 MG tablet Take 10 mg by mouth daily.      [name] aspirin 81 MG tablet Take 81 mg by mouth daily.      [name]  atenolol (TENORMIN) 100 MG tablet Take 100 mg by mouth daily.      [name] atorvastatin (LIPITOR) 80 MG tablet Take 80 mg by mouth daily.      . clopidogrel (PLAVIX) 75 MG tablet Take 1 tablet (75 mg total) by mouth daily.  30 tablet  0  . fenofibrate (LOFIBRA) 160 MG tablet Take 160 mg by mouth daily.      . fluticasone (FLONASE) 50 MCG/ACT nasal spray Place 2 sprays into the nose daily.      [name] loratadine (CLARITIN) 10 MG tablet Take 10 mg by mouth daily.      . NON FORMULARY IV Immunoglobulin quarterly      . omeprazole (PRILOSEC) 20 MG capsule Take 20 mg by mouth daily.      [name] zolpidem (AMBIEN) 10 MG tablet Take 10 mg by mouth at bedtime as needed.       No current facility-administered medications for this visit.    PHYSICAL EXAMINATION: ECOG PERFORMANCE STATUS: 0 - Asymptomatic  Filed Vitals:   08/14/14 1523  BP: 144/79  Pulse: 59  Temp: 98.1 F (36.7 C)  Resp: 18   Filed Weights   08/14/14 1523  Weight: 173 lb 12.8 oz (78.835 kg)    GENERAL:alert, no distress and comfortable SKIN: skin color, texture, turgor are normal, no rashes or significant lesions EYES: normal, Conjunctiva are pink and non-injected, sclera clear Musculoskeletal:no cyanosis of digits and no clubbing  NEURO: alert & oriented x 3 with fluent speech, no focal motor/sensory deficits  LABORATORY DATA:  I have reviewed the data as listed    Component Value Date/Time   NA 138 08/07/2014 0822   NA 141 11/10/2012 0513   K 4.7 08/07/2014 0822   K 4.3 11/10/2012 0513   CL 109* 03/14/2013 0835   CL 108 11/10/2012 0513   CO2 26 08/07/2014 0822   CO2 27 11/10/2012 0513   GLUCOSE 87 08/07/2014 0822   GLUCOSE 117* 03/14/2013 0835   GLUCOSE 92 11/10/2012 0513   BUN 31.5* 08/07/2014 0822   BUN 14 11/10/2012 0513   CREATININE 1.6* 08/07/2014 0822   CREATININE 1.29 11/10/2012 0513   CALCIUM 10.2 08/07/2014 0822   CALCIUM 8.4 11/10/2012 0513   PROT 7.5 08/07/2014 0822   PROT 7.1 08/09/2012 0757   ALBUMIN 4.3 08/07/2014  0822   ALBUMIN 4.3 08/09/2012 0757   AST 48* 08/07/2014 0822   AST 15 08/09/2012 0757   ALT 28 08/07/2014 0822   ALT 12 08/09/2012 0757   ALKPHOS 28* 08/07/2014 0822   ALKPHOS 27* 08/09/2012 0757   BILITOT 0.75 08/07/2014 0822   BILITOT 0.4 08/09/2012 0757   GFRNONAA 60* 11/10/2012 0513   GFRAA 69* 11/10/2012 0513    No results found for this basename: SPEP,  UPEP,   kappa and lambda light chains    Lab Results  Component Value Date   WBC 3.9* 08/07/2014   NEUTROABS 2.0 08/07/2014   HGB 13.0 08/07/2014   HCT 39.7 08/07/2014   MCV 85.4 08/07/2014   PLT 177 08/07/2014      Chemistry      Component Value Date/Time   NA 138 08/07/2014 0822   NA 141 11/10/2012 0513  K 4.7 08/07/2014 0822   K 4.3 11/10/2012 0513   CL 109* 03/14/2013 0835   CL 108 11/10/2012 0513   CO2 26 08/07/2014 0822   CO2 27 11/10/2012 0513   BUN 31.5* 08/07/2014 0822   BUN 14 11/10/2012 0513   CREATININE 1.6* 08/07/2014 0822   CREATININE 1.29 11/10/2012 0513      Component Value Date/Time   CALCIUM 10.2 08/07/2014 0822   CALCIUM 8.4 11/10/2012 0513   ALKPHOS 28* 08/07/2014 0822   ALKPHOS 27* 08/09/2012 0757   AST 48* 08/07/2014 0822   AST 15 08/09/2012 0757   ALT 28 08/07/2014 0822   ALT 12 08/09/2012 0757   BILITOT 0.75 08/07/2014 0822   BILITOT 0.4 08/09/2012 0757      ASSESSMENT & PLAN:  MULTIPLE MYELOMA, IN REMISSION The patient remain in remission. He has been 8 years since his transplant. I recommend history, physical examination, blood work, 24-hour urine collection and skeletal survey to be done a week before his return visit next year. In the meantime, he will continue close followup with his primary care provider. I recommend yearly influenza vaccination.  Hypogammaglobulinemia, acquired His last IVIG infusion in May of 1157 was complicated by serum sickness. The patient describes fevers, chills, mild myalgia with feeling unwell for several days after treatment. After a prolonged discussion, we are in  agreement to discontinue IVIG since May 2015. He has no recent infection. His most recent immunoglobulin level was satisfactory.   Acute on chronic renal failure His blood work show mild dehydration which I suspect is due to his fasting state. I recommend close monitoring of blood pressure and nephrology followup. I recommend increase oral fluid hydration.    Orders Placed This Encounter  Procedures  . DG Bone Survey Met    Standing Status: Future     Number of Occurrences:      Standing Expiration Date: 10/14/2015    Order Specific Question:  Reason for Exam (SYMPTOM  OR DIAGNOSIS REQUIRED)    Answer:  staging myeloma    Order Specific Question:  Preferred imaging location?    Answer:  [facility]  . CBC with Differential    Standing Status: Future     Number of Occurrences:      Standing Expiration Date: 10/14/2015  . Comprehensive metabolic panel    Standing Status: Future     Number of Occurrences:      Standing Expiration Date: 10/14/2015  . SPEP & IFE with QIG    Standing Status: Future     Number of Occurrences:      Standing Expiration Date: 10/14/2015  . Kappa/lambda light chains    Standing Status: Future     Number of Occurrences:      Standing Expiration Date: 10/14/2015  . Beta 2 microglobulin, serum    Standing Status: Future     Number of Occurrences:      Standing Expiration Date: 10/14/2015  . Protein Electro, 24-Hour Urine    Standing Status: Future     Number of Occurrences:      Standing Expiration Date: 10/14/2015  . IFE, Urine (with Tot Prot)    Standing Status: Future     Number of Occurrences:      Standing Expiration Date: 10/14/2015   All questions were answered. The patient knows to call the clinic with any problems, questions or concerns. No barriers to learning was detected. I spent 15 minutes counseling the patient face to face. The total time  spent in the appointment was 20 minutes and more than 50% was on counseling and  review of test results     [facility], [place], MD 08/14/2014 3:42 PM

## 2014-08-15 ENCOUNTER — Telehealth: Payer: Self-pay | Admitting: Hematology and Oncology

## 2014-08-15 NOTE — Telephone Encounter (Signed)
s.w.l pt and advised on Aug 2016 appt....pt ok adn aware

## 2014-08-21 ENCOUNTER — Other Ambulatory Visit: Payer: BC Managed Care – PPO

## 2014-08-30 ENCOUNTER — Ambulatory Visit: Payer: BC Managed Care – PPO | Admitting: Hematology and Oncology

## 2014-09-01 ENCOUNTER — Encounter: Payer: Self-pay | Admitting: *Deleted

## 2014-09-01 NOTE — Progress Notes (Signed)
Received notice pt has been scheduled to see Dr. Justin Mend at [facility] on 09/06/14 at 11 am.  Faxed over most recent office note and labs to fax [phone].

## 2014-10-26 ENCOUNTER — Telehealth: Payer: Self-pay | Admitting: *Deleted

## 2014-10-26 ENCOUNTER — Telehealth: Payer: Self-pay | Admitting: Hematology and Oncology

## 2014-10-26 NOTE — Telephone Encounter (Signed)
added appt per MD staff...done...Marland Kitchenper staff pt aware

## 2014-10-26 NOTE — Telephone Encounter (Signed)
Pt reports intermittent sharp shooting pains from calf/ back of knee up to thigh at least 6 or more times per day. Very painful when it happens.  Does not matter if pt sitting or standing, does not seem to be related to movement.  It happens randomly.  Started about 2 weeks ago.  Denies any swelling or redness in leg.

## 2014-10-26 NOTE — Telephone Encounter (Signed)
Informed pt of appt tomorrow at 10;45 am.  He verbalized understanding.  Request sent to Scheduler.

## 2014-10-26 NOTE — Telephone Encounter (Signed)
Pt left VM reporting "severe pain" in his knee to his thigh for several weeks.  Called pt back for more information and left VM asking him to return nurse's call again.

## 2014-10-26 NOTE — Telephone Encounter (Signed)
I need to see him tomorrow, no labs, 1045 am

## 2014-10-27 ENCOUNTER — Ambulatory Visit (HOSPITAL_COMMUNITY)
Admission: RE | Admit: 2014-10-27 | Discharge: 2014-10-27 | Disposition: A | Discharge status: Home / Self Care | Payer: BC Managed Care – PPO | Source: Ambulatory Visit | Attending: Hematology and Oncology | Admitting: Hematology and Oncology

## 2014-10-27 ENCOUNTER — Encounter: Payer: Self-pay | Admitting: Hematology and Oncology

## 2014-10-27 ENCOUNTER — Other Ambulatory Visit: Payer: Self-pay | Admitting: Hematology and Oncology

## 2014-10-27 ENCOUNTER — Ambulatory Visit (HOSPITAL_BASED_OUTPATIENT_CLINIC_OR_DEPARTMENT_OTHER): Payer: BC Managed Care – PPO | Admitting: Hematology and Oncology

## 2014-10-27 ENCOUNTER — Telehealth: Payer: Self-pay | Admitting: Hematology and Oncology

## 2014-10-27 VITALS — BP 145/85 | HR 59 | Temp 98.5°F | Resp 18 | Ht 69.0 in | Wt 171.3 lb

## 2014-10-27 DIAGNOSIS — M79604 Pain in right leg: Secondary | ICD-10-CM

## 2014-10-27 DIAGNOSIS — M79651 Pain in right thigh: Secondary | ICD-10-CM

## 2014-10-27 DIAGNOSIS — C9001 Multiple myeloma in remission: Secondary | ICD-10-CM

## 2014-10-27 DIAGNOSIS — Z9660 Presence of unspecified orthopedic joint implant: Secondary | ICD-10-CM | POA: Insufficient documentation

## 2014-10-27 DIAGNOSIS — M25561 Pain in right knee: Secondary | ICD-10-CM

## 2014-10-27 HISTORY — DX: Pain in right leg: M79.604

## 2014-10-27 NOTE — Telephone Encounter (Signed)
printed pt sched adn avs and sent to radiology

## 2014-10-27 NOTE — Assessment & Plan Note (Signed)
X-ray of his leg show no evidence of bone disease. Significant vascular calcification is seen. I suspect he may have intermittent claudication from peripheral vascular disease. I recommend he discuss further management with his cardiologist.

## 2014-10-27 NOTE — Progress Notes (Signed)
[place] OFFICE PROGRESS NOTE  Patient Care Team: Tivis Ringer, MD as PCP - General (Internal Medicine)  SUMMARY OF ONCOLOGIC HISTORY:  He presented with a one-month history of right groin pain at age 58 in March of 2004. Bone scan showed some subtle abnormalities but was nondiagnostic done 01/12/2003. He underwent an MRI of the hip which showed a 3 cm expansile lesion in the right acetabulum. He was initially evaluated at [facility]. A CT guided biopsy was positive for plasma cells. He was referred to Dr. Truddie Coco. Bone marrow biopsy done 03/12/2003 showed only 5% plasma cells. He had a mild elevation of IgA immunoglobulin at 477 mg; IgG kappa on IFE percent. Urine was negative for free light chains. Serum beta 2 microglobulin 2.38.  He underwent initial radiation 20 gray 10 fractions between 329 and 04/04/2003. He did well until February 2005 when he had recurrent right hip pain. MRI done February 17, 2004 showed a recurrent large lesion measuring 2.6 x 7.7 cm. He received additional radiation 45 gray in 25 fractions between March 28 and 04/19/2004.  In October 2005, a soft tissue lesion was noted on the lateral aspect of his left leg. This was excised in December and found to be a plasmacytoma.[name] He received local radiation 30 gray in 10 fractions between January 4 and 12/28/2004.  Due to another local recurrence in the right hip, he underwent a total right hip replacement by Dr. Wynelle Link in February 2006.  In April 2006 he developed a soft tissue lesion on the left chest wall and again received radiation 30 gray in 10 fractions between July 17 and July 28 06.  He was started on pulse dexamethasone in August 2006 and then a combination of Velcade, dexamethasone, and thalidomide beginning 09/08/2005. He received 5 cycles of treatment through 12/01/2005. He developed a steroid myopathy.  He was referred to [facility] where he underwent high dose intravenous melphalan  chemotherapy 200 mg per meter squared with autologous stem cell support on 02/25/2006.  He achieved a durable remission subsequent to those treatments.  As a consequence of his myeloma and its treatments, he has developed hypogammaglobulinemia and has had problems with recurrent sinopulmonary infections. He was started on a program of monthly intravenous immunoglobulin 4 years ago with excellent results and decrease in the frequency and severity of infections. Frequency of the IVIG infusions has been subsequently decreased to every 3 months, discontinued in May 2015.  INTERVAL HISTORY: Please see below for problem oriented charting. The patient asked to be seen urgently because of intermittent pain of his right thigh and right knee. It is described as a sharp pain. He would have 6-10 times a day with no identifiable aggravating factors or relieving factors. Each episode of sharp pain lasts a few seconds. He denies leg swelling. Denies recent trauma to his legs.  REVIEW OF SYSTEMS:   Constitutional: Denies fevers, chills or abnormal weight loss Eyes: Denies blurriness of vision Ears, nose, mouth, throat, and face: Denies mucositis or sore throat Respiratory: Denies cough, dyspnea or wheezes Cardiovascular: Denies palpitation, chest discomfort or lower extremity swelling Gastrointestinal:  Denies nausea, heartburn or change in bowel habits Skin: Denies abnormal skin rashes Lymphatics: Denies new lymphadenopathy or easy bruising Neurological:Denies numbness, tingling or new weaknesses Behavioral/Psych: Mood is stable, no new changes  All other systems were reviewed with the patient and are negative.  I have reviewed the past medical history, past surgical history, social history and family history  with the patient and they are unchanged from previous note.  ALLERGIES:  has No Known Allergies.  MEDICATIONS:  Current Outpatient Prescriptions  Medication Sig Dispense Refill  . amLODipine  (NORVASC) 10 MG tablet Take 10 mg by mouth daily.    [name] aspirin 81 MG tablet Take 81 mg by mouth daily.    [name] atenolol (TENORMIN) 100 MG tablet Take 100 mg by mouth daily.    [name] atorvastatin (LIPITOR) 80 MG tablet Take 80 mg by mouth daily.    . cholecalciferol (VITAMIN D) 1000 UNITS tablet Take 2,000 Units by mouth daily.    . clopidogrel (PLAVIX) 75 MG tablet Take 1 tablet (75 mg total) by mouth daily. 30 tablet 0  . fenofibrate (LOFIBRA) 160 MG tablet Take 160 mg by mouth daily.    . fluticasone (FLONASE) 50 MCG/ACT nasal spray Place 2 sprays into the nose daily.    [name] loratadine (CLARITIN) 10 MG tablet Take 10 mg by mouth daily.    . NON FORMULARY IV Immunoglobulin quarterly    . omeprazole (PRILOSEC) 20 MG capsule Take 20 mg by mouth daily.    . vitamin B-12 (CYANOCOBALAMIN) 100 MCG tablet Take 100 mcg by mouth daily.    [name] zolpidem (AMBIEN) 10 MG tablet Take 10 mg by mouth at bedtime as needed.     No current facility-administered medications for this visit.    PHYSICAL EXAMINATION: ECOG PERFORMANCE STATUS: 0 - Asymptomatic  Filed Vitals:   10/27/14 1026  BP: 145/85  Pulse: 59  Temp: 98.5 F (36.9 C)  Resp: 18   Filed Weights   10/27/14 1026  Weight: 171 lb 4.8 oz (77.701 kg)    GENERAL:alert, no distress and comfortable SKIN: skin color, texture, turgor are normal, no rashes or significant lesions EYES: normal, Conjunctiva are pink and non-injected, sclera clear OROPHARYNX:no exudate, no erythema and lips, buccal mucosa, and tongue normal  NECK: supple, thyroid normal size, non-tender, without nodularity LYMPH:  no palpable lymphadenopathy in the cervical, axillary or inguinal LUNGS: clear to auscultation and percussion with normal breathing effort HEART: regular rate & rhythm and no murmurs and no lower extremity edema ABDOMEN:abdomen soft, non-tender and normal bowel sounds Musculoskeletal:no cyanosis of digits and no clubbing  NEURO: alert & oriented x 3 with fluent  speech, no focal motor/sensory deficits  LABORATORY DATA:  I have reviewed the data as listed    Component Value Date/Time   NA 138 08/07/2014 0822   NA 141 11/10/2012 0513   K 4.7 08/07/2014 0822   K 4.3 11/10/2012 0513   CL 109* 03/14/2013 0835   CL 108 11/10/2012 0513   CO2 26 08/07/2014 0822   CO2 27 11/10/2012 0513   GLUCOSE 87 08/07/2014 0822   GLUCOSE 117* 03/14/2013 0835   GLUCOSE 92 11/10/2012 0513   BUN 31.5* 08/07/2014 0822   BUN 14 11/10/2012 0513   CREATININE 1.6* 08/07/2014 0822   CREATININE 1.29 11/10/2012 0513   CALCIUM 10.2 08/07/2014 0822   CALCIUM 8.4 11/10/2012 0513   PROT 7.5 08/07/2014 0822   PROT 7.1 08/09/2012 0757   ALBUMIN 4.3 08/07/2014 0822   ALBUMIN 4.3 08/09/2012 0757   AST 48* 08/07/2014 0822   AST 15 08/09/2012 0757   ALT 28 08/07/2014 0822   ALT 12 08/09/2012 0757   ALKPHOS 28* 08/07/2014 0822   ALKPHOS 27* 08/09/2012 0757   BILITOT 0.75 08/07/2014 0822   BILITOT 0.4 08/09/2012 0757   GFRNONAA 60* 11/10/2012 0513   GFRAA 69* 11/10/2012  1655    No results found for: SPEP, UPEP  Lab Results  Component Value Date   WBC 3.9* 08/07/2014   NEUTROABS 2.0 08/07/2014   HGB 13.0 08/07/2014   HCT 39.7 08/07/2014   MCV 85.4 08/07/2014   PLT 177 08/07/2014      Chemistry      Component Value Date/Time   NA 138 08/07/2014 0822   NA 141 11/10/2012 0513   K 4.7 08/07/2014 0822   K 4.3 11/10/2012 0513   CL 109* 03/14/2013 0835   CL 108 11/10/2012 0513   CO2 26 08/07/2014 0822   CO2 27 11/10/2012 0513   BUN 31.5* 08/07/2014 0822   BUN 14 11/10/2012 0513   CREATININE 1.6* 08/07/2014 0822   CREATININE 1.29 11/10/2012 0513      Component Value Date/Time   CALCIUM 10.2 08/07/2014 0822   CALCIUM 8.4 11/10/2012 0513   ALKPHOS 28* 08/07/2014 0822   ALKPHOS 27* 08/09/2012 0757   AST 48* 08/07/2014 0822   AST 15 08/09/2012 0757   ALT 28 08/07/2014 0822   ALT 12 08/09/2012 0757   BILITOT 0.75 08/07/2014 0822   BILITOT 0.4 08/09/2012  0757       RADIOGRAPHIC STUDIES: I have personally reviewed the radiological images as listed and agreed with the findings in the report. Dg Hip Complete Right  10/27/2014   CLINICAL DATA:  Right leg pain x2 weeks .  EXAM: RIGHT HIP - COMPLETE 2+ VIEW  COMPARISON:  02/03/2005.  FINDINGS: Total right hip replacement. Good anatomic alignment. Sclerotic density right ilium unchanged consistent bone island. Degenerative changes left hip. Peripheral vascular calcification consistent with peripheral vascular disease.  IMPRESSION: Patient has had prior right hip replacement. Good anatomic alignment noted. No complicating features noted. Exam is stable from prior study of 2006.   Electronically Signed   By: Marcello Moores  Register   On: 10/27/2014 11:56   Dg Femur Right  10/27/2014   CLINICAL DATA:  Right leg pain without injury.  EXAM: RIGHT FEMUR - 2 VIEW  COMPARISON:  February 03, 2005.  FINDINGS: Status post right total hip arthroplasty. No fracture or dislocation is noted. The femoral and acetabular components appear to be well situated. Vascular calcifications are noted.  IMPRESSION: Status post right total hip arthroplasty. No acute abnormality seen in the right femur.   Electronically Signed   By: Sabino Dick M.D.   On: 10/27/2014 11:52   Dg Tibia/fibula Right  10/27/2014   CLINICAL DATA:  Right leg pain x2 weeks. No injury. Initial evaluation.  EXAM: RIGHT TIBIA AND FIBULA - 2 VIEW  COMPARISON:  None.  FINDINGS: Vascular calcification consistent atherosclerotic vascular disease. No acute bony or joint abnormality identified. No evidence of fracture or dislocation. Mild diffuse osteopenia.  IMPRESSION: 1.  Mild diffuse osteopenia.  No acute abnormality .  2.  Peripheral vascular disease.   Electronically Signed   By: Kress   On: 10/27/2014 11:58   Dg Foot 2 Views Right  10/27/2014   CLINICAL DATA:  Entire right leg pain. Pain for 2 weeks. No known injury.  EXAM: RIGHT FOOT - 2 VIEW  COMPARISON:   None.  FINDINGS: There is no evidence of fracture or dislocation. There is no evidence of arthropathy or other focal bone abnormality. Soft tissues are unremarkable.  IMPRESSION: Negative.   Electronically Signed   By: Rolm Baptise M.D.   On: 10/27/2014 11:54     ASSESSMENT & PLAN:  Right leg pain X-ray of  his leg show no evidence of bone disease. Significant vascular calcification is seen. I suspect he may have intermittent claudication from peripheral vascular disease. I recommend he discuss further management with his cardiologist.   No orders of the defined types were placed in this encounter.   All questions were answered. The patient knows to call the clinic with any problems, questions or concerns. No barriers to learning was detected. I spent 15 minutes counseling the patient face to face. The total time spent in the appointment was 20 minutes and more than 50% was on counseling and review of test results     [facility], [place], MD 10/27/2014 1:32 PM

## 2015-01-26 ENCOUNTER — Other Ambulatory Visit: Payer: Self-pay | Admitting: *Deleted

## 2015-01-26 DIAGNOSIS — M79604 Pain in right leg: Secondary | ICD-10-CM

## 2015-02-22 ENCOUNTER — Ambulatory Visit (INDEPENDENT_AMBULATORY_CARE_PROVIDER_SITE_OTHER): Payer: 59 | Admitting: Neurology

## 2015-02-22 DIAGNOSIS — M79604 Pain in right leg: Secondary | ICD-10-CM

## 2015-02-22 DIAGNOSIS — M5417 Radiculopathy, lumbosacral region: Secondary | ICD-10-CM

## 2015-02-22 NOTE — Procedures (Signed)
[facility]  [address]  [place], [place] [address] Tel: [phone] Fax:  [phone] Test Date:  02/22/2015  Patient: [name] DOB: 1956/10/25 Physician: Narda Amber  Sex: Male Height: 5\' 9"  Ref Phys: Joyce Copa  ID#: [id number] Temp: 34.0C Technician: Laureen Ochs R. NCS T.   Patient Complaints: Patient is a 59 year old male here for evaluation of his intermittent right shooting leg pain and paresthesias.   NCV & EMG Findings: Extensive electrodiagnostic testing of the right lower extremity shows:  1. Right sural and superficial peroneal sensory responses are within normal limits. 2. Right peroneal and tibial motor responses are within normal limits. 3. Chronic motor axon loss changes are seen affecting the L5-S1 myotomes, without accompanied active denervation.  Impression: 1. Chronic L5-S1 radiculopathy affecting the right lower extremity, mild in degree electrically. 2. There is no evidence of a generalized sensorimotor polyneuropathy affecting the right lower extremity.   ___________________________ Narda Amber    Nerve Conduction Studies Anti Sensory Summary Table   Stim Site NR Peak (ms) Norm Peak (ms) P-T Amp (V) Norm P-T Amp  Right Sup Peroneal Anti Sensory (Ant Lat Mall)  12 cm    2.6 <4.6 6.3 >3  Right Sural Anti Sensory (Lat Mall)  Calf    3.5 <4.6 7.1 >3   Motor Summary Table   Stim Site NR Onset (ms) Norm Onset (ms) O-P Amp (mV) Norm O-P Amp Site1 Site2 Delta-0 (ms) Dist (cm) Vel (m/s) Norm Vel (m/s)  Right Peroneal Motor (Ext Dig Brev)  Ankle    3.0 <6.0 4.1 >2.5 B Fib Ankle 7.3 35.0 48 >40  B Fib    10.3  3.8  Poplt B Fib 1.6 8.5 53 >40  Poplt    11.9  3.4         Right Peroneal TA Motor (Tib Ant)  Fib Head    2.3 <4.5 6.2 >3 Poplit Fib Head 1.5 7.0 47 >40  Poplit    3.8  6.1         Right Tibial Motor (Abd Hall Brev)  Ankle    3.0 <6.0 11.0 >4 Knee Ankle 7.9 43.0 54 >40  Knee    10.9  9.1          H Reflex  Studies   NR H-Lat (ms) Lat Norm (ms) L-R H-Lat (ms)  Right Tibial (Gastroc)     31.56 <35    EMG   Side Muscle Ins Act Fibs Psw Fasc Number Recrt Dur Dur. Amp Amp. Poly Poly. Comment  Right AntTibialis Nml Nml Nml Nml 1- Rapid Some 1+ Some 1+ Nml Nml N/A  Right Gastroc Nml Nml Nml Nml 1- Rapid Some 1+ Some 1+ Nml Nml N/A  Right Flex Dig Long Nml Nml Nml Nml 1- Mod-R Some 1+ Nml Nml Nml Nml N/A  Right BicepsFemS Nml Nml Nml Nml 1- Mod-R Some 1+ Nml Nml Nml Nml N/A  Right GluteusMed Nml Nml Nml Nml 1- Mod-R Some 1+ Some 1+ Nml Nml N/A  Right RectFemoris Nml Nml Nml Nml Nml Nml Nml Nml Nml Nml Nml Nml N/A      Waveforms:

## 2015-04-26 ENCOUNTER — Encounter: Payer: Self-pay | Admitting: Hematology and Oncology

## 2015-04-26 NOTE — Progress Notes (Unsigned)
04/26/2015 Received a message to call this patient in reference to the approx. Cost of a bone survey.  I contacted radiology and was given a cost of 300.00.  I gave this information to the patient because he is changing insurance sompanie and wanted to know what his out of pocket would be.  I advised the patient to call his carrier and speak with them concerning the benefit for this procedure. Patient voiced understanding.  [name]

## 2015-05-15 ENCOUNTER — Telehealth: Payer: Self-pay | Admitting: Hematology and Oncology

## 2015-05-15 NOTE — Telephone Encounter (Signed)
returned call and s.w. pt and transferred call to radiology he needed to r/s bone survey

## 2015-08-13 ENCOUNTER — Ambulatory Visit (HOSPITAL_COMMUNITY): Payer: BC Managed Care – PPO

## 2015-08-13 ENCOUNTER — Other Ambulatory Visit: Payer: BC Managed Care – PPO

## 2015-08-15 ENCOUNTER — Ambulatory Visit (HOSPITAL_BASED_OUTPATIENT_CLINIC_OR_DEPARTMENT_OTHER): Payer: Medicare Other

## 2015-08-15 ENCOUNTER — Ambulatory Visit (HOSPITAL_COMMUNITY)
Admission: RE | Admit: 2015-08-15 | Discharge: 2015-08-15 | Disposition: A | Discharge status: Home / Self Care | Payer: Medicare Other | Source: Ambulatory Visit | Attending: Hematology and Oncology | Admitting: Hematology and Oncology

## 2015-08-15 DIAGNOSIS — C9001 Multiple myeloma in remission: Secondary | ICD-10-CM

## 2015-08-15 LAB — CBC WITH DIFFERENTIAL/PLATELET
BASO%: 0.7 % (ref 0.0–2.0)
Basophils Absolute: 0 10*3/uL (ref 0.0–0.1)
EOS%: 1.3 % (ref 0.0–7.0)
Eosinophils Absolute: 0.1 10*3/uL (ref 0.0–0.5)
HCT: 40.2 % (ref 38.4–49.9)
HGB: 13 g/dL (ref 13.0–17.1)
LYMPH%: 36.1 % (ref 14.0–49.0)
MCH: 28.1 pg (ref 27.2–33.4)
MCHC: 32.4 g/dL (ref 32.0–36.0)
MCV: 86.6 fL (ref 79.3–98.0)
MONO#: 0.7 10*3/uL (ref 0.1–0.9)
MONO%: 17.5 % — ABNORMAL HIGH (ref 0.0–14.0)
NEUT%: 44.4 % (ref 39.0–75.0)
NEUTROS ABS: 1.8 10*3/uL (ref 1.5–6.5)
PLATELETS: 171 10*3/uL (ref 140–400)
RBC: 4.64 10*6/uL (ref 4.20–5.82)
RDW: 15.6 % — ABNORMAL HIGH (ref 11.0–14.6)
WBC: 4.1 10*3/uL (ref 4.0–10.3)
lymph#: 1.5 10*3/uL (ref 0.9–3.3)

## 2015-08-15 LAB — COMPREHENSIVE METABOLIC PANEL (CC13)
ALT: 14 U/L (ref 0–55)
ANION GAP: 10 meq/L (ref 3–11)
AST: 19 U/L (ref 5–34)
Albumin: 4.2 g/dL (ref 3.5–5.0)
Alkaline Phosphatase: 31 U/L — ABNORMAL LOW (ref 40–150)
BILIRUBIN TOTAL: 0.64 mg/dL (ref 0.20–1.20)
BUN: 21.7 mg/dL (ref 7.0–26.0)
CHLORIDE: 107 meq/L (ref 98–109)
CO2: 24 meq/L (ref 22–29)
Calcium: 9.8 mg/dL (ref 8.4–10.4)
Creatinine: 1.6 mg/dL — ABNORMAL HIGH (ref 0.7–1.3)
EGFR: 45 mL/min/{1.73_m2} — ABNORMAL LOW (ref >90)
GLUCOSE: 101 mg/dL (ref 70–140)
Potassium: 4.5 mEq/L (ref 3.5–5.1)
SODIUM: 141 meq/L (ref 136–145)
Total Protein: 7 g/dL (ref 6.4–8.3)

## 2015-08-17 LAB — SPEP & IFE WITH QIG
Albumin ELP: 4.3 g/dL (ref 3.8–4.8)
Alpha-1-Globulin: 0.3 g/dL (ref 0.2–0.3)
Alpha-2-Globulin: 0.6 g/dL (ref 0.5–0.9)
BETA GLOBULIN: 0.6 g/dL (ref 0.4–0.6)
Beta 2: 0.4 g/dL (ref 0.2–0.5)
Gamma Globulin: 0.7 g/dL — ABNORMAL LOW (ref 0.8–1.7)
IGA: 309 mg/dL (ref 68–379)
IGM, SERUM: 53 mg/dL (ref 41–251)
IgG (Immunoglobin G), Serum: 767 mg/dL (ref 650–1600)
Total Protein, Serum Electrophoresis: 7 g/dL (ref 6.1–8.1)

## 2015-08-17 LAB — KAPPA/LAMBDA LIGHT CHAINS
KAPPA FREE LGHT CHN: 2.48 mg/dL — AB (ref 0.33–1.94)
Kappa:Lambda Ratio: 1.81 — ABNORMAL HIGH (ref 0.26–1.65)
LAMBDA FREE LGHT CHN: 1.37 mg/dL (ref 0.57–2.63)

## 2015-08-20 ENCOUNTER — Telehealth: Payer: Self-pay | Admitting: Hematology and Oncology

## 2015-08-20 ENCOUNTER — Ambulatory Visit (HOSPITAL_BASED_OUTPATIENT_CLINIC_OR_DEPARTMENT_OTHER): Payer: Medicare Other | Admitting: Hematology and Oncology

## 2015-08-20 ENCOUNTER — Encounter: Payer: Self-pay | Admitting: Hematology and Oncology

## 2015-08-20 VITALS — BP 154/84 | HR 58 | Temp 98.5°F | Resp 18 | Ht 69.0 in | Wt 175.4 lb

## 2015-08-20 DIAGNOSIS — C9001 Multiple myeloma in remission: Secondary | ICD-10-CM | POA: Diagnosis not present

## 2015-08-20 DIAGNOSIS — G622 Polyneuropathy due to other toxic agents: Secondary | ICD-10-CM

## 2015-08-20 DIAGNOSIS — IMO0002 Reserved for concepts with insufficient information to code with codable children: Secondary | ICD-10-CM

## 2015-08-20 DIAGNOSIS — N183 Chronic kidney disease, stage 3 unspecified: Secondary | ICD-10-CM

## 2015-08-20 NOTE — Telephone Encounter (Signed)
Gave and printed appt sched and avs for pt for Aug 2017 °

## 2015-08-20 NOTE — Assessment & Plan Note (Signed)
The patient remain in remission. He has been 8 years since his transplant. I recommend history, physical examination, & blood work and to defer, 24-hour urine collection and skeletal survey. In the meantime, he will continue close followup with his primary care provider. I recommend yearly influenza vaccination.

## 2015-08-20 NOTE — Progress Notes (Signed)
[facility] OFFICE PROGRESS NOTE  Patient Care Team: Prince Solian, MD as PCP - General (Internal Medicine) Edrick Oh, MD as Consulting Physician (Nephrology)  SUMMARY OF ONCOLOGIC HISTORY:  He presented with a one-month history of right groin pain at age 59 in March of 2004. Bone scan showed some subtle abnormalities but was nondiagnostic done 01/12/2003. He underwent an MRI of the hip which showed a 3 cm expansile lesion in the right acetabulum. He was initially evaluated at [facility]. A CT guided biopsy was positive for plasma cells. He was referred to Dr. Truddie Coco. Bone marrow biopsy done 03/12/2003 showed only 5% plasma cells. He had a mild elevation of IgA immunoglobulin at 477 mg; IgG kappa on IFE percent. Urine was negative for free light chains. Serum beta 2 microglobulin 2.38.  He underwent initial radiation 20 gray 10 fractions between 329 and 04/04/2003. He did well until February 2005 when he had recurrent right hip pain. MRI done February 17, 2004 showed a recurrent large lesion measuring 2.6 x 7.7 cm. He received additional radiation 45 gray in 25 fractions between March 28 and 04/19/2004.  In October 2005, a soft tissue lesion was noted on the lateral aspect of his left leg. This was excised in December and found to be a plasmacytoma.[name] He received local radiation 30 gray in 10 fractions between January 4 and 12/28/2004.  Due to another local recurrence in the right hip, he underwent a total right hip replacement by Dr. Wynelle Link in February 2006.  In April 2006 he developed a soft tissue lesion on the left chest wall and again received radiation 30 gray in 10 fractions between July 17 and July 28 06.  He was started on pulse dexamethasone in August 2006 and then a combination of Velcade, dexamethasone, and thalidomide beginning 09/08/2005. He received 5 cycles of treatment through 12/01/2005. He developed a steroid myopathy.  He was referred to [facility]  where he underwent high dose intravenous melphalan chemotherapy 200 mg per meter squared with autologous stem cell support on 02/25/2006.  He achieved a durable remission subsequent to those treatments.  As a consequence of his myeloma and its treatments, he has developed hypogammaglobulinemia and has had problems with recurrent sinopulmonary infections. He was started on a program of monthly intravenous immunoglobulin 4 years ago with excellent results and decrease in the frequency and severity of infections. Frequency of the IVIG infusions has been subsequently decreased to every 3 months, discontinued in May 2015.  INTERVAL HISTORY: Please see below for problem oriented charting.[name] He feels well. Denies recent infection. He continues to have persistent neuropathy but the patient has learned to cope living with that. He denies bone pain  REVIEW OF SYSTEMS:   Constitutional: Denies fevers, chills or abnormal weight loss Eyes: Denies blurriness of vision Ears, nose, mouth, throat, and face: Denies mucositis or sore throat Respiratory: Denies cough, dyspnea or wheezes Cardiovascular: Denies palpitation, chest discomfort or lower extremity swelling Gastrointestinal:  Denies nausea, heartburn or change in bowel habits Skin: Denies abnormal skin rashes Lymphatics: Denies new lymphadenopathy or easy bruising Neurological:Denies numbness, tingling or new weaknesses Behavioral/Psych: Mood is stable, no new changes  All other systems were reviewed with the patient and are negative.  I have reviewed the past medical history, past surgical history, social history and family history with the patient and they are unchanged from previous note.  ALLERGIES:  has No Known Allergies.  MEDICATIONS:  Current Outpatient Prescriptions  Medication Sig Dispense  Refill  . amLODipine (NORVASC) 10 MG tablet Take 10 mg by mouth daily.    [name] aspirin 81 MG tablet Take 81 mg by mouth daily.    [name] atenolol (TENORMIN)  100 MG tablet Take 100 mg by mouth daily.    [name] atorvastatin (LIPITOR) 80 MG tablet Take 80 mg by mouth daily.    . cholecalciferol (VITAMIN D) 1000 UNITS tablet Take 2,000 Units by mouth daily.    . clopidogrel (PLAVIX) 75 MG tablet Take 1 tablet (75 mg total) by mouth daily. 30 tablet 0  . fenofibrate (LOFIBRA) 160 MG tablet Take 160 mg by mouth daily.    [name] loratadine (CLARITIN) 10 MG tablet Take 10 mg by mouth daily.    . NON FORMULARY IV Immunoglobulin quarterly    . omeprazole (PRILOSEC) 20 MG capsule Take 20 mg by mouth daily.    . vitamin B-12 (CYANOCOBALAMIN) 100 MCG tablet Take 100 mcg by mouth daily.    [name] zolpidem (AMBIEN) 10 MG tablet Take 10 mg by mouth at bedtime as needed.     No current facility-administered medications for this visit.    PHYSICAL EXAMINATION: ECOG PERFORMANCE STATUS: 0 - Asymptomatic  Filed Vitals:   08/20/15 0828  BP: 154/84  Pulse: 58  Temp: 98.5 F (36.9 C)  Resp: 18   Filed Weights   08/20/15 0828  Weight: 175 lb 6.4 oz (79.561 kg)    GENERAL:alert, no distress and comfortable SKIN: skin color, texture, turgor are normal, no rashes or significant lesions EYES: normal, Conjunctiva are pink and non-injected, sclera clear OROPHARYNX:no exudate, no erythema and lips, buccal mucosa, and tongue normal  NECK: supple, thyroid normal size, non-tender, without nodularity LYMPH:  no palpable lymphadenopathy in the cervical, axillary or inguinal LUNGS: clear to auscultation and percussion with normal breathing effort HEART: regular rate & rhythm and no murmurs and no lower extremity edema ABDOMEN:abdomen soft, non-tender and normal bowel sounds Musculoskeletal:no cyanosis of digits and no clubbing  NEURO: alert & oriented x 3 with fluent speech, no focal motor/sensory deficits  LABORATORY DATA:  I have reviewed the data as listed    Component Value Date/Time   NA 141 08/15/2015 0814   NA 141 11/10/2012 0513   K 4.5 08/15/2015 0814   K 4.3  11/10/2012 0513   CL 109* 03/14/2013 0835   CL 108 11/10/2012 0513   CO2 24 08/15/2015 0814   CO2 27 11/10/2012 0513   GLUCOSE 101 08/15/2015 0814   GLUCOSE 117* 03/14/2013 0835   GLUCOSE 92 11/10/2012 0513   BUN 21.7 08/15/2015 0814   BUN 14 11/10/2012 0513   CREATININE 1.6* 08/15/2015 0814   CREATININE 1.29 11/10/2012 0513   CALCIUM 9.8 08/15/2015 0814   CALCIUM 8.4 11/10/2012 0513   PROT 7.0 08/15/2015 0814   PROT 7.1 08/09/2012 0757   ALBUMIN 4.2 08/15/2015 0814   ALBUMIN 4.3 08/09/2012 0757   AST 19 08/15/2015 0814   AST 15 08/09/2012 0757   ALT 14 08/15/2015 0814   ALT 12 08/09/2012 0757   ALKPHOS 31* 08/15/2015 0814   ALKPHOS 27* 08/09/2012 0757   BILITOT 0.64 08/15/2015 0814   BILITOT 0.4 08/09/2012 0757   GFRNONAA 60* 11/10/2012 0513   GFRAA 69* 11/10/2012 0513    No results found for: SPEP, UPEP  Lab Results  Component Value Date   WBC 4.1 08/15/2015   NEUTROABS 1.8 08/15/2015   HGB 13.0 08/15/2015   HCT 40.2 08/15/2015   MCV 86.6 08/15/2015  PLT 171 08/15/2015      Chemistry      Component Value Date/Time   NA 141 08/15/2015 0814   NA 141 11/10/2012 0513   K 4.5 08/15/2015 0814   K 4.3 11/10/2012 0513   CL 109* 03/14/2013 0835   CL 108 11/10/2012 0513   CO2 24 08/15/2015 0814   CO2 27 11/10/2012 0513   BUN 21.7 08/15/2015 0814   BUN 14 11/10/2012 0513   CREATININE 1.6* 08/15/2015 0814   CREATININE 1.29 11/10/2012 0513      Component Value Date/Time   CALCIUM 9.8 08/15/2015 0814   CALCIUM 8.4 11/10/2012 0513   ALKPHOS 31* 08/15/2015 0814   ALKPHOS 27* 08/09/2012 0757   AST 19 08/15/2015 0814   AST 15 08/09/2012 0757   ALT 14 08/15/2015 0814   ALT 12 08/09/2012 0757   BILITOT 0.64 08/15/2015 0814   BILITOT 0.4 08/09/2012 0757       RADIOGRAPHIC STUDIES: I reviewed the imaging study with him I have personally reviewed the radiological images as listed and agreed with the findings in the report.   ASSESSMENT & PLAN:  MULTIPLE  MYELOMA, IN REMISSION The patient remain in remission. He has been 8 years since his transplant. I recommend history, physical examination, & blood work and to defer, 24-hour urine collection and skeletal survey. In the meantime, he will continue close followup with his primary care provider. I recommend yearly influenza vaccination.    Peripheral neuropathy, secondary to drugs or chemicals The patient is not taking any medication for this as he felt only medications are not helping his symptoms but caused significant side effects. The patient has learned to cope with it.  Chronic kidney disease, stage III (moderate) he will continue current medical management. His kidney function has been stable.   Orders Placed This Encounter  Procedures  . Comprehensive metabolic panel    Standing Status: Future     Number of Occurrences:      Standing Expiration Date: 09/23/2016  . CBC with Differential/Platelet    Standing Status: Future     Number of Occurrences:      Standing Expiration Date: 09/23/2016  . SPEP & IFE with QIG    Standing Status: Future     Number of Occurrences:      Standing Expiration Date: 09/23/2016  . Kappa/lambda light chains    Standing Status: Future     Number of Occurrences:      Standing Expiration Date: 09/23/2016   All questions were answered. The patient knows to call the clinic with any problems, questions or concerns. No barriers to learning was detected. I spent 15 minutes counseling the patient face to face. The total time spent in the appointment was 20 minutes and more than 50% was on counseling and review of test results     [facility], [place], MD 08/20/2015 8:58 AM

## 2015-08-20 NOTE — Assessment & Plan Note (Signed)
The patient is not taking any medication for this as he felt only medications are not helping his symptoms but caused significant side effects. The patient has learned to cope with it.

## 2015-08-20 NOTE — Assessment & Plan Note (Addendum)
he will continue current medical management. His kidney function has been stable. 

## 2015-08-21 ENCOUNTER — Telehealth: Payer: Self-pay | Admitting: *Deleted

## 2015-08-21 NOTE — Telephone Encounter (Signed)
-----   Message from Heath Lark, MD sent at 08/21/2015  1:13 PM EDT ----- Regarding: urine test is neg Pls let him know ----- Message -----    From: [facility]    Sent: 08/15/2015   8:25 AM      To: Heath Lark, MD

## 2015-08-21 NOTE — Telephone Encounter (Signed)
Informed pt of Urine tests negative per Dr. Alvy Bimler.  He verbalized understanding.

## 2015-08-23 LAB — UIFE/LIGHT CHAINS/TP QN, 24-HR UR
Albumin, U: DETECTED
TIME-UPE24: 24 h
TOTAL PROTEIN, URINE-UPE24: 4 mg/dL — AB (ref 5–25)
Volume, Urine: 2800 mL

## 2015-08-23 LAB — 24 HR URINE,KAPPA/LAMBDA LIGHT CHAINS
Measured Kappa Chain: <0.4 mg/dL (ref <2.00)
Measured Lambda Chain: <0.4 mg/dL (ref <2.00)
URINE VOLUME: 2800 mL/(24.h)

## 2015-08-23 LAB — UPEP/TP, 24-HR URINE
Collection Interval: 24 hours
TOTAL PROTEIN, URINE/DAY: 112 mg/d — AB (ref 50–100)
Total Protein, Urine: 4 mg/dL
Total Volume, Urine: 2800 mL

## 2015-12-18 ENCOUNTER — Other Ambulatory Visit: Payer: Self-pay | Admitting: General Surgery

## 2016-03-24 ENCOUNTER — Telehealth: Payer: Self-pay | Admitting: *Deleted

## 2016-03-24 NOTE — Telephone Encounter (Signed)
Called pt in response to a Office Outlook email pt sent to Dr. Alvy Bimler this morning.   Email stated he had been sick since Thursday and he did call our office but got routed to Triage.  He thought being routed to Triage was not normal so he sent Dr. Alvy Bimler a email.    S/w pt and he reports fevers, cough, congestion since Thursday.  He has not seen any MD.  He says he was told by his prior Oncologist to always call Oncologist first.   Explained to pt he is off treatment and in remission so Dr. Alvy Bimler recommends pt see his PCP for any illnesses not related to his history of cancer.  Instructed pt to call his PCP today or go to Urgent Care.  It sounds like he could have the Flu.  Also informed him it is normal and routine for patients to be routed to Triage first especially if they are c/o any illness.  Also explained Dr. Alvy Bimler does not routinely check office email so please call and s/w Triage for any urgent issues.  He verbalized understanding.  He says he will call his PCP for his symptoms.

## 2016-04-25 ENCOUNTER — Telehealth: Payer: Self-pay | Admitting: Hematology and Oncology

## 2016-04-25 NOTE — Telephone Encounter (Signed)
pt cld to r/s lab appt to 8/24 @7 :45

## 2016-07-31 ENCOUNTER — Telehealth: Payer: Self-pay | Admitting: *Deleted

## 2016-07-31 IMAGING — CR DG BONE SURVEY MET
9 of 10 series · 9 of 10 positions shown · non-contrast
Comparison: 02/20/2014.

CLINICAL DATA: Myeloma staging.

EXAM:
METASTATIC BONE SURVEY

[w chest pa]
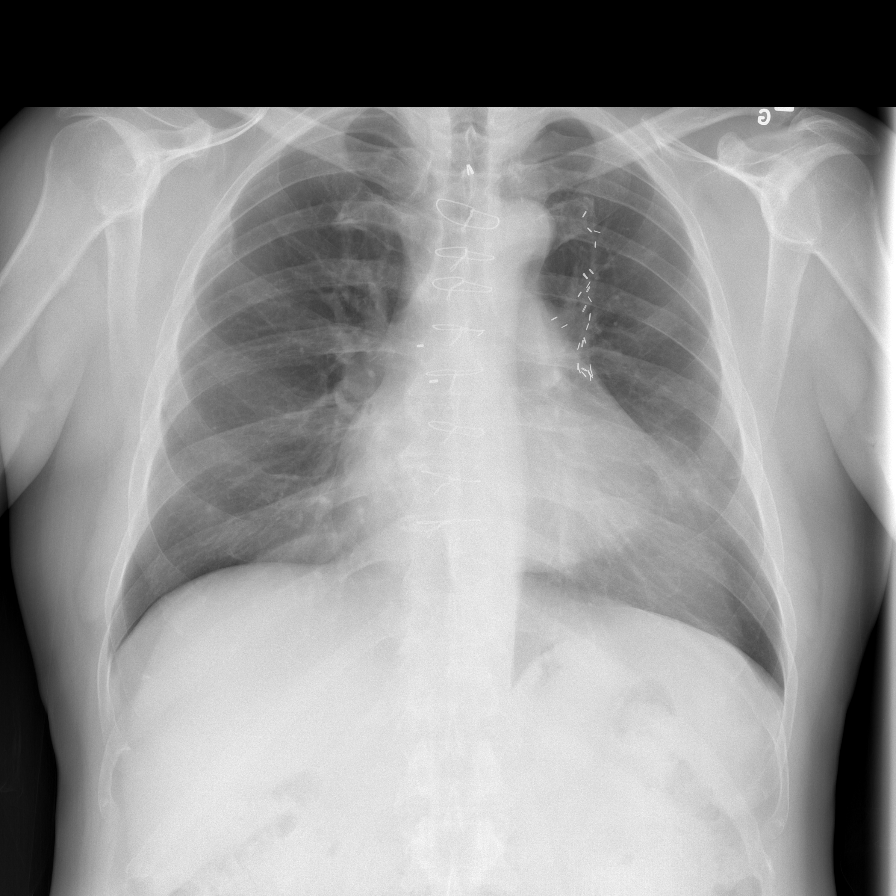

[w c-spine lat]
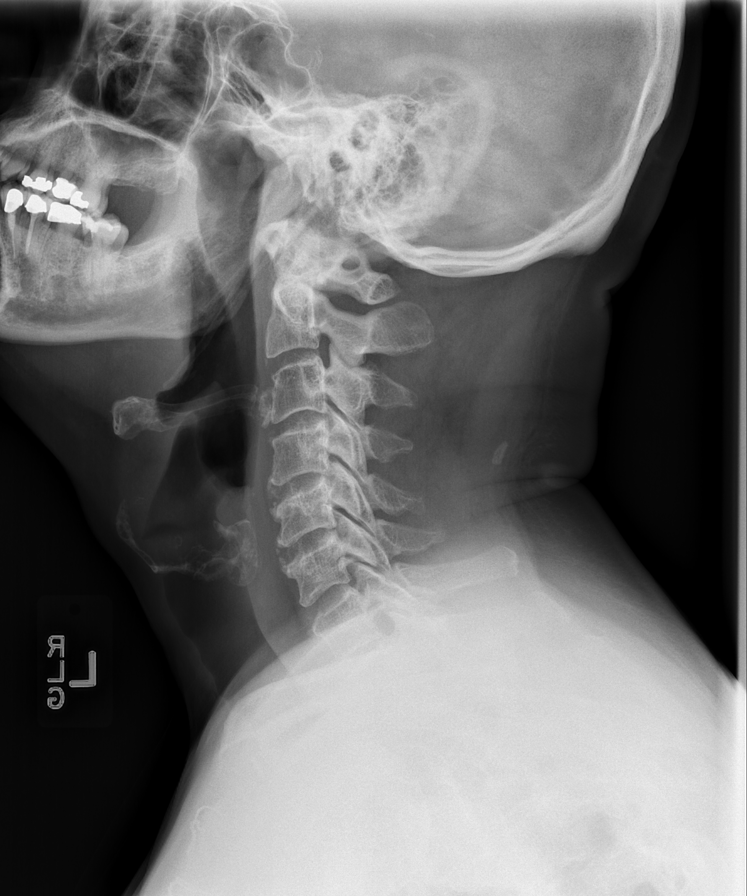

[w skull lat *]
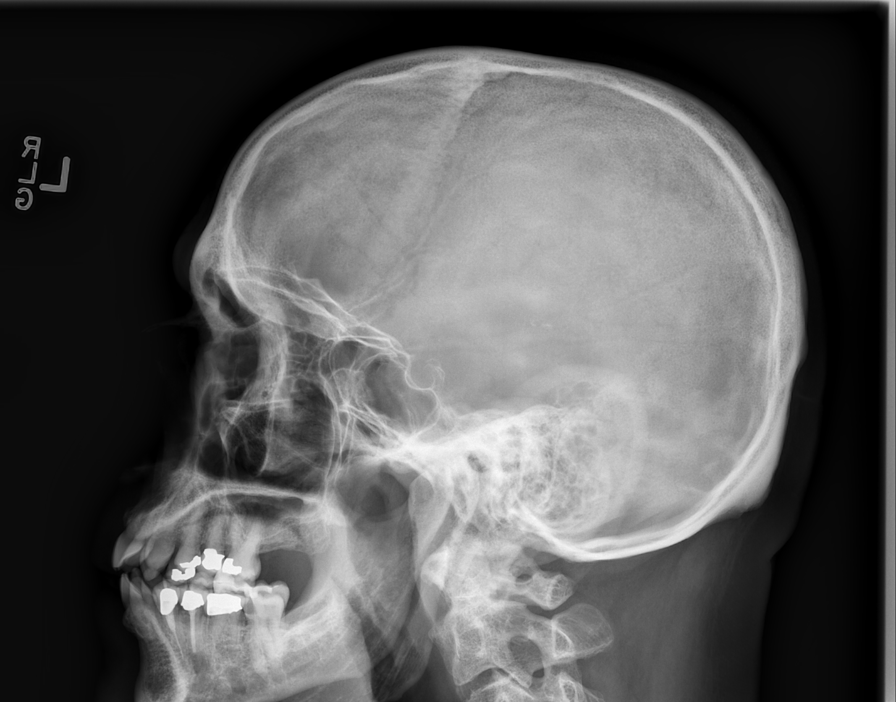

[w c-spine a.p.]
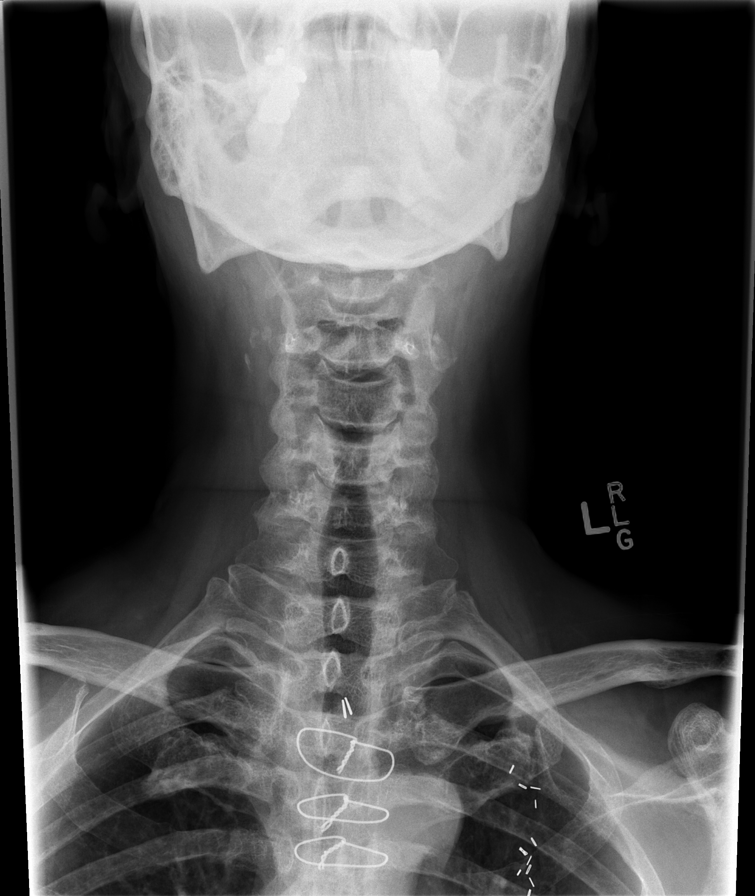

[w shoulder ap external left]
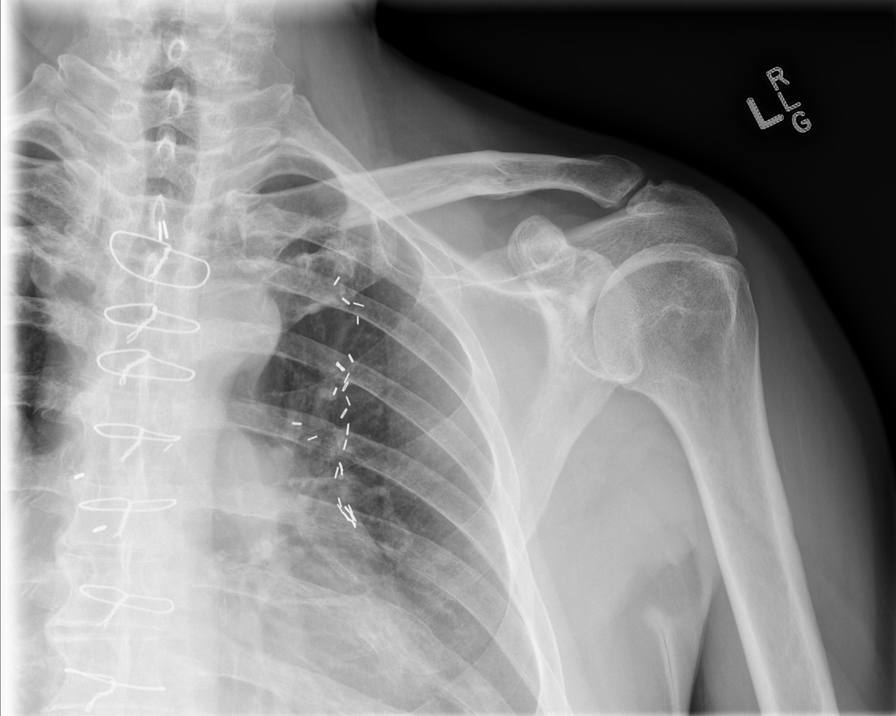

[w shoulder ap external right]
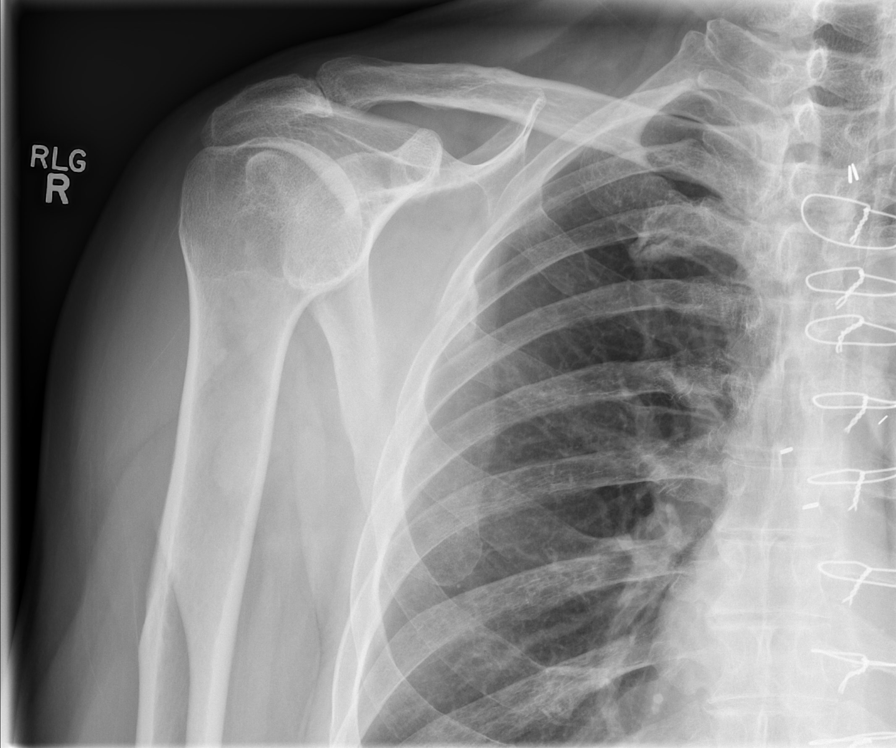

[w humerus ap right *]
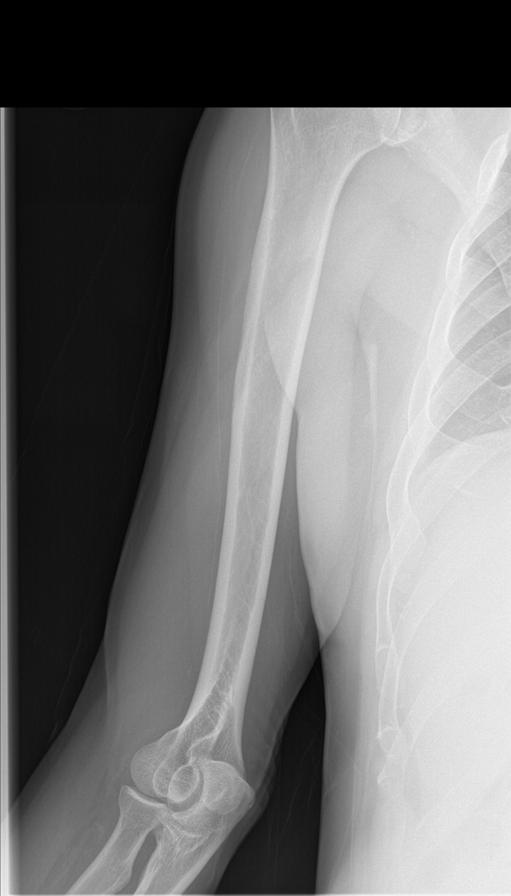

[w humerus ap left *]
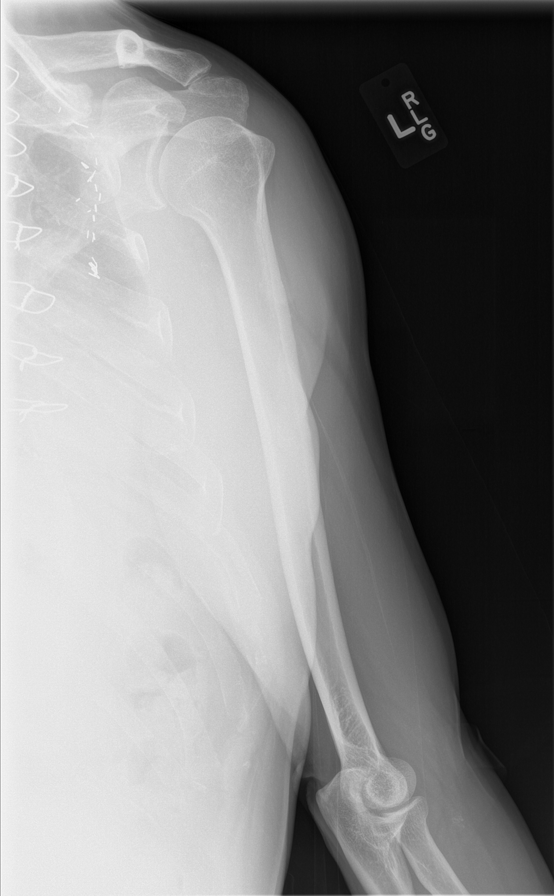

[x forearm ap right]
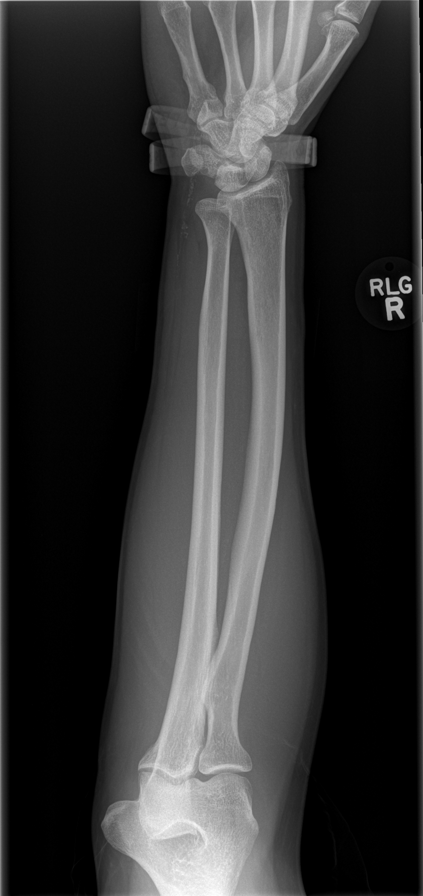

[9 of 10 positions shown; findings below may reference images not displayed]

FINDINGS: Multiple images of the axial and appendicular skeleton again
obtained. Chronic vascular calcification present. Prior CABG.
Cervical, thoracic, lumbar spine degenerative change present. Total
right hip replacement. Slight deformity the right greater trochanter
is stable. Stable deformity left posterior eighth rib. Peripheral
vascular calcification present.
IMPRESSION: No evidence of progressive changes of myeloma or metastatic disease.
Stable bony findings as above. Exam is stable from 02/20/2014.

## 2016-07-31 NOTE — Telephone Encounter (Signed)
No need urine collection this time

## 2016-07-31 NOTE — Telephone Encounter (Signed)
Pt LVM asking if he will need to pick up a Jug for 24 hr urine before his lab appt on 8/24?

## 2016-07-31 NOTE — Telephone Encounter (Signed)
Informed pt no need for 24 hr urine this time,  Just blood work.  Pt verbalized understanding.

## 2016-08-12 ENCOUNTER — Other Ambulatory Visit: Payer: Medicare Other

## 2016-08-13 ENCOUNTER — Emergency Department (HOSPITAL_BASED_OUTPATIENT_CLINIC_OR_DEPARTMENT_OTHER)
Admission: EM | Admit: 2016-08-13 | Discharge: 2016-08-14 | Disposition: A | Discharge status: Home / Self Care | Payer: Medicare Other | Attending: Emergency Medicine | Admitting: Emergency Medicine

## 2016-08-13 ENCOUNTER — Encounter (HOSPITAL_BASED_OUTPATIENT_CLINIC_OR_DEPARTMENT_OTHER): Payer: Self-pay | Admitting: *Deleted

## 2016-08-13 DIAGNOSIS — I2510 Atherosclerotic heart disease of native coronary artery without angina pectoris: Secondary | ICD-10-CM | POA: Insufficient documentation

## 2016-08-13 DIAGNOSIS — Z951 Presence of aortocoronary bypass graft: Secondary | ICD-10-CM | POA: Insufficient documentation

## 2016-08-13 DIAGNOSIS — N183 Chronic kidney disease, stage 3 (moderate): Secondary | ICD-10-CM | POA: Diagnosis not present

## 2016-08-13 DIAGNOSIS — Z87891 Personal history of nicotine dependence: Secondary | ICD-10-CM | POA: Insufficient documentation

## 2016-08-13 DIAGNOSIS — K1379 Other lesions of oral mucosa: Secondary | ICD-10-CM

## 2016-08-13 DIAGNOSIS — Z7982 Long term (current) use of aspirin: Secondary | ICD-10-CM | POA: Insufficient documentation

## 2016-08-13 DIAGNOSIS — Z96641 Presence of right artificial hip joint: Secondary | ICD-10-CM | POA: Diagnosis not present

## 2016-08-13 DIAGNOSIS — I129 Hypertensive chronic kidney disease with stage 1 through stage 4 chronic kidney disease, or unspecified chronic kidney disease: Secondary | ICD-10-CM | POA: Insufficient documentation

## 2016-08-13 NOTE — ED Triage Notes (Signed)
Pt c/o tongue bleeding 4 hrs , w/o injury, pt is on Plavix and aspirin

## 2016-08-14 ENCOUNTER — Other Ambulatory Visit (HOSPITAL_BASED_OUTPATIENT_CLINIC_OR_DEPARTMENT_OTHER): Payer: Medicare Other

## 2016-08-14 DIAGNOSIS — C9001 Multiple myeloma in remission: Secondary | ICD-10-CM

## 2016-08-14 LAB — COMPREHENSIVE METABOLIC PANEL
ALT: 28 U/L (ref 0–55)
AST: 37 U/L — ABNORMAL HIGH (ref 5–34)
Albumin: 4.3 g/dL (ref 3.5–5.0)
Alkaline Phosphatase: 34 U/L — ABNORMAL LOW (ref 40–150)
Anion Gap: 12 mEq/L — ABNORMAL HIGH (ref 3–11)
BILIRUBIN TOTAL: 0.85 mg/dL (ref 0.20–1.20)
BUN: 23.9 mg/dL (ref 7.0–26.0)
CHLORIDE: 108 meq/L (ref 98–109)
CO2: 25 mEq/L (ref 22–29)
CREATININE: 1.4 mg/dL — AB (ref 0.7–1.3)
Calcium: 10.4 mg/dL (ref 8.4–10.4)
EGFR: 53 mL/min/{1.73_m2} — ABNORMAL LOW (ref >90)
Glucose: 89 mg/dl (ref 70–140)
Potassium: 4.3 mEq/L (ref 3.5–5.1)
Sodium: 144 mEq/L (ref 136–145)
Total Protein: 7.8 g/dL (ref 6.4–8.3)

## 2016-08-14 LAB — CBC WITH DIFFERENTIAL/PLATELET
BASO%: 0.6 % (ref 0.0–2.0)
Basophils Absolute: 0 10*3/uL (ref 0.0–0.1)
EOS ABS: 0.1 10*3/uL (ref 0.0–0.5)
EOS%: 2.1 % (ref 0.0–7.0)
HCT: 41.2 % (ref 38.4–49.9)
HGB: 13.4 g/dL (ref 13.0–17.1)
LYMPH#: 1.3 10*3/uL (ref 0.9–3.3)
LYMPH%: 32.2 % (ref 14.0–49.0)
MCH: 27.9 pg (ref 27.2–33.4)
MCHC: 32.5 g/dL (ref 32.0–36.0)
MCV: 85.9 fL (ref 79.3–98.0)
MONO#: 0.6 10*3/uL (ref 0.1–0.9)
MONO%: 14.3 % — ABNORMAL HIGH (ref 0.0–14.0)
NEUT#: 2 10*3/uL (ref 1.5–6.5)
NEUT%: 50.8 % (ref 39.0–75.0)
PLATELETS: 180 10*3/uL (ref 140–400)
RBC: 4.8 10*6/uL (ref 4.20–5.82)
RDW: 15.2 % — ABNORMAL HIGH (ref 11.0–14.6)
WBC: 4 10*3/uL (ref 4.0–10.3)

## 2016-08-14 MED ORDER — SILVER NITRATE-POT NITRATE 75-25 % EX MISC
CUTANEOUS | Status: AC
  Administered 2016-08-14: 03:00:00
  Filled 2016-08-14: qty 1

## 2016-08-14 MED ORDER — LIDOCAINE-EPINEPHRINE 2 %-1:100000 IJ SOLN
INTRAMUSCULAR | Status: AC
  Administered 2016-08-14: 1 mL
  Filled 2016-08-14: qty 1

## 2016-08-14 NOTE — ED Provider Notes (Signed)
[facility] DEPT [facility] Provider Note   CSN: [id number] Arrival date & time: 08/13/16  2324     History   Chief Complaint Chief Complaint  Patient presents with  . Other    tongue bleeding    HPI [name] is a 60 y.o. male on Plavix and aspirin who complains of his tongue bleeding since about 7:30 yesterday evening. Bleeding has been steady and moderate. He denies trauma to his tongue. He is attempted pressure with gauze as well as a tea bag without relief. There is no associated pain. He has had some nausea but no vomiting.  HPI  Past Medical History:  Diagnosis Date  . B12 deficiency   . CAD (coronary artery disease)   . Chronic renal insufficiency   . Diverticulitis   . GERD (gastroesophageal reflux disease)   . Gout   . HTN (hypertension)   . Hx of stem cell transplant ([place]) 02/2006  . Hypercholesteremia   . Hypogammaglobulinemia, acquired ([place]) 01/21/2013   Due to high doe chemo for myeloma  . MI (myocardial infarction) [facility])    MI June 2012  . multiple myeloma dx'd 2005   chemo/xrt comp 2006  . Peripheral neuropathy ([facility])    chronic  . Peripheral neuropathy, secondary to drugs or chemicals 02/27/2014   Due to chemotherapy given for myeloma  . Personal history of colonic polyps 11/01/2012   2006 - 6 adenomas max 6 mm ([place]) 2009 - inflammatory polyps x 2 11/01/2012 sigmoid 10 mm and rectal 8 mm polyps - retention polyps    . Plasmocytoma ([place]) dx'd 2007   IVIG treatment in progress  . Recurrent infections    after stem cell transplant - on Iv IgG  . Right leg pain 10/27/2014  . Rosacea     Patient Active Problem List   Diagnosis Date Noted  . Chronic kidney disease, stage III (moderate) 08/20/2015  . Right leg pain 10/27/2014  . Acute on chronic renal failure ([place]) 05/16/2014  . Peripheral neuropathy, secondary to drugs or chemicals 02/27/2014  . Hypogammaglobulinemia, acquired ([place]) 01/21/2013  . Rectal bleeding 11/09/2012  . Personal history of  colonic polyps 11/01/2012  . ALLERGIC RHINITIS CAUSE UNSPECIFIED 11/24/2007  . MULTIPLE MYELOMA, IN REMISSION 11/09/2007  . HYPERTENSION 11/09/2007  . CORONARY ATHEROSCLEROSIS OF ARTERY BYPASS GRAFT 11/09/2007  . INSOMNIA 11/09/2007  . HIP REPLACEMENT, RIGHT, HX OF 11/09/2007    Past Surgical History:  Procedure Laterality Date  . COLONOSCOPY W/ POLYPECTOMY    . CORONARY ANGIOPLASTY WITH STENT PLACEMENT  05/2011   Dr. Quillian Quince in [facility]  . CORONARY ARTERY BYPASS GRAFT  08/19/1993   [facility] in [place], [place]  11/09/2012   Procedure: FLEXIBLE SIGMOIDOSCOPY;  Surgeon: Milus Banister, MD;  Location: [facility];  Service: Endoscopy;  Laterality: N/A;  . HERNIA REPAIR  09/2010   left, Dr. Donne Hazel  . LASIK  10/2009   Dr. Gershon Crane  . stent placement for hydronephrosis  11/2004   Dr. Diona Fanti  . TOTAL HIP ARTHROPLASTY  02/03/2005   right       Home Medications    Prior to Admission medications   Medication Sig Start Date End Date Taking? Authorizing Provider  amLODipine (NORVASC) 10 MG tablet Take 10 mg by mouth daily.    Historical Provider, MD  aspirin 81 MG tablet Take 81 mg by mouth daily.    Historical Provider, MD  atenolol (TENORMIN) 100 MG tablet Take 100 mg by mouth daily.  Historical Provider, MD  atorvastatin (LIPITOR) 80 MG tablet Take 80 mg by mouth daily.    Historical Provider, MD  cholecalciferol (VITAMIN D) 1000 UNITS tablet Take 2,000 Units by mouth daily.    Historical Provider, MD  clopidogrel (PLAVIX) 75 MG tablet Take 1 tablet (75 mg total) by mouth daily. 11/15/12   Willia Craze, NP  fenofibrate (LOFIBRA) 160 MG tablet Take 160 mg by mouth daily.    Historical Provider, MD  loratadine (CLARITIN) 10 MG tablet Take 10 mg by mouth daily.    Historical Provider, MD  NON FORMULARY IV Immunoglobulin quarterly    Historical Provider, MD  omeprazole (PRILOSEC) 20 MG capsule Take 20 mg by mouth daily.    Historical  Provider, MD  vitamin B-12 (CYANOCOBALAMIN) 100 MCG tablet Take 100 mcg by mouth daily.    Historical Provider, MD  zolpidem (AMBIEN) 10 MG tablet Take 10 mg by mouth at bedtime as needed.    Historical Provider, MD    Family History Family History  Problem Relation Age of Onset  . Heart attack Father 41    x 2  . Colon cancer Mother   . Colon polyps Mother     Social History Social History  Substance Use Topics  . Smoking status: Former Smoker    Quit date: 12/22/1992  . Smokeless tobacco: Never Used  . Alcohol use 0.0 oz/week     Comment: 1 glass of wine/ day     Allergies   Review of patient's allergies indicates no known allergies.   Review of Systems Review of Systems  All other systems reviewed and are negative.    Physical Exam Updated Vital Signs BP 184/83   Pulse (!) 53   Resp 16   SpO2 99%   Physical Exam General: Well-developed, well-nourished male in no acute distress; appearance consistent with age of record HENT: normocephalic; small bleeding wound to dorsal tongue Eyes: pupils equal, round and reactive to light; extraocular muscles intact Neck: supple Heart: regular rate and rhythm Lungs: clear to auscultation bilaterally Abdomen: soft; nondistended Extremities: No deformity; full range of motion Neurologic: Awake, alert and oriented; motor function intact in all extremities and symmetric; no facial droop Skin: Warm and dry Psychiatric: Normal mood and affect    ED Treatments / Results   2:25 AM A tea bag was soaked in 2% lidocaine with epinephrine and the patient held this in place for approximately 30 minutes. The bleeding was controlled. The bleeding site itself was then cauterized with silver nitrate. He continues to be hemostatic at this time.  Procedures (including critical care time)   Final Clinical Impressions(s) / ED Diagnoses   Final diagnoses:  Bleeding in mouth   I personally performed the services described in this  documentation, which was scribed in my presence. The recorded information has been reviewed and is accurate.    Shanon Rosser, MD 08/14/16 [phone]

## 2016-08-15 LAB — KAPPA/LAMBDA LIGHT CHAINS
Ig Kappa Free Light Chain: 22.2 mg/L — ABNORMAL HIGH (ref 3.3–19.4)
Ig Lambda Free Light Chain: 16.6 mg/L (ref 5.7–26.3)
KAPPA/LAMBDA FLC RATIO: 1.34 (ref 0.26–1.65)

## 2016-08-18 LAB — MULTIPLE MYELOMA PANEL, SERUM
ALBUMIN SERPL ELPH-MCNC: 4.2 g/dL (ref 2.9–4.4)
ALPHA 1: 0.2 g/dL (ref 0.0–0.4)
Albumin/Glob SerPl: 1.5 (ref 0.7–1.7)
Alpha2 Glob SerPl Elph-Mcnc: 0.6 g/dL (ref 0.4–1.0)
B-Globulin SerPl Elph-Mcnc: 1.3 g/dL (ref 0.7–1.3)
GLOBULIN, TOTAL: 2.9 g/dL (ref 2.2–3.9)
Gamma Glob SerPl Elph-Mcnc: 0.7 g/dL (ref 0.4–1.8)
IGA/IMMUNOGLOBULIN A, SERUM: 301 mg/dL (ref 61–437)
IGM (IMMUNOGLOBIN M), SRM: 69 mg/dL (ref 20–172)
IgG, Qn, Serum: 792 mg/dL (ref 700–1600)
TOTAL PROTEIN: 7.1 g/dL (ref 6.0–8.5)

## 2016-08-19 ENCOUNTER — Telehealth: Payer: Self-pay | Admitting: Hematology and Oncology

## 2016-08-19 ENCOUNTER — Encounter: Payer: Self-pay | Admitting: Hematology and Oncology

## 2016-08-19 ENCOUNTER — Ambulatory Visit (HOSPITAL_BASED_OUTPATIENT_CLINIC_OR_DEPARTMENT_OTHER): Payer: Medicare Other | Admitting: Hematology and Oncology

## 2016-08-19 VITALS — BP 146/89 | HR 55 | Temp 98.0°F | Resp 18 | Ht 69.0 in | Wt 165.1 lb

## 2016-08-19 DIAGNOSIS — C9001 Multiple myeloma in remission: Secondary | ICD-10-CM

## 2016-08-19 DIAGNOSIS — I25810 Atherosclerosis of coronary artery bypass graft(s) without angina pectoris: Secondary | ICD-10-CM

## 2016-08-19 DIAGNOSIS — N183 Chronic kidney disease, stage 3 unspecified: Secondary | ICD-10-CM

## 2016-08-19 NOTE — Assessment & Plan Note (Signed)
The patient remain in remission. He has been in remission 10 years since his transplant. I recommend history, physical examination, & blood work and to defer, 24-hour urine collection and skeletal survey. In the meantime, he will continue close followup with his primary care provider. I recommend yearly influenza vaccination.

## 2016-08-19 NOTE — Assessment & Plan Note (Signed)
he will continue current medical management. His kidney function has been stable. 

## 2016-08-19 NOTE — Progress Notes (Signed)
[place] OFFICE PROGRESS NOTE  Patient Care Team: Prince Solian, MD as PCP - General (Internal Medicine) Edrick Oh, MD as Consulting Physician (Nephrology)  SUMMARY OF ONCOLOGIC HISTORY:  He presented with a one-month history of right groin pain at age 60 in March of 2004. Bone scan showed some subtle abnormalities but was nondiagnostic done 01/12/2003. He underwent an MRI of the hip which showed a 3 cm expansile lesion in the right acetabulum. He was initially evaluated at [facility]. A CT guided biopsy was positive for plasma cells. He was referred to Dr. Truddie Coco. Bone marrow biopsy done 03/12/2003 showed only 5% plasma cells. He had a mild elevation of IgA immunoglobulin at 477 mg; IgG kappa on IFE percent. Urine was negative for free light chains. Serum beta 2 microglobulin 2.38.  He underwent initial radiation 20 gray 10 fractions between 329 and 04/04/2003. He did well until February 2005 when he had recurrent right hip pain. MRI done February 17, 2004 showed a recurrent large lesion measuring 2.6 x 7.7 cm. He received additional radiation 45 gray in 25 fractions between March 28 and 04/19/2004.  In October 2005, a soft tissue lesion was noted on the lateral aspect of his left leg. This was excised in December and found to be a plasmacytoma.[name] He received local radiation 30 gray in 10 fractions between January 4 and 12/28/2004.  Due to another local recurrence in the right hip, he underwent a total right hip replacement by Dr. Wynelle Link in February 2006.  In April 2006 he developed a soft tissue lesion on the left chest wall and again received radiation 30 gray in 10 fractions between July 17 and July 28 06.  He was started on pulse dexamethasone in August 2006 and then a combination of Velcade, dexamethasone, and thalidomide beginning 09/08/2005. He received 5 cycles of treatment through 12/01/2005. He developed a steroid myopathy.  He was referred to [facility]  where he underwent high dose intravenous melphalan chemotherapy 200 mg per meter squared with autologous stem cell support on 02/25/2006.  He achieved a durable remission subsequent to those treatments.  As a consequence of his myeloma and its treatments, he has developed hypogammaglobulinemia and has had problems with recurrent sinopulmonary infections. He was started on a program of monthly intravenous immunoglobulin 4 years ago with excellent results and decrease in the frequency and severity of infections. Frequency of the IVIG infusions has been subsequently decreased to every 3 months, discontinued in May 2015.  INTERVAL HISTORY: Please see below for problem oriented charting. He feels well. Denies recent infection. No new bone pain. The patient had recent extensive 4 hour bleeding complications after accidental injury to his tongue requiring emergency room visit. He is taking dual antiplatelet agent since his heart surgery 60 more than 5 years ago. He denies symptoms of angina such as chest pain or shortness of breath  REVIEW OF SYSTEMS:   Constitutional: Denies fevers, chills or abnormal weight loss Eyes: Denies blurriness of vision Ears, nose, mouth, throat, and face: Denies mucositis or sore throat Respiratory: Denies cough, dyspnea or wheezes Cardiovascular: Denies palpitation, chest discomfort or lower extremity swelling Gastrointestinal:  Denies nausea, heartburn or change in bowel habits Skin: Denies abnormal skin rashes Lymphatics: Denies new lymphadenopathy or easy bruising Neurological:Denies numbness, tingling or new weaknesses Behavioral/Psych: Mood is stable, no new changes  All other systems were reviewed with the patient and are negative.  I have reviewed the past medical history, past surgical history, social  history and family history with the patient and they are unchanged from previous note.  ALLERGIES:  has No Known Allergies.  MEDICATIONS:  Current Outpatient  Prescriptions  Medication Sig Dispense Refill  . amLODipine (NORVASC) 10 MG tablet Take 10 mg by mouth daily.    . aspirin 81 MG tablet Take 81 mg by mouth daily.    . atenolol (TENORMIN) 100 MG tablet Take 100 mg by mouth daily.    . atorvastatin (LIPITOR) 80 MG tablet Take 80 mg by mouth daily.    . cholecalciferol (VITAMIN D) 1000 UNITS tablet Take 2,000 Units by mouth daily.    . clopidogrel (PLAVIX) 75 MG tablet Take 1 tablet (75 mg total) by mouth daily. 30 tablet 0  . fenofibrate (LOFIBRA) 160 MG tablet Take 160 mg by mouth daily.    . loratadine (CLARITIN) 10 MG tablet Take 10 mg by mouth daily.    . NON FORMULARY IV Immunoglobulin quarterly    . omeprazole (PRILOSEC) 20 MG capsule Take 20 mg by mouth daily.    . vitamin B-12 (CYANOCOBALAMIN) 100 MCG tablet Take 100 mcg by mouth daily.    . zolpidem (AMBIEN) 10 MG tablet Take 10 mg by mouth at bedtime as needed.     No current facility-administered medications for this visit.     PHYSICAL EXAMINATION: ECOG PERFORMANCE STATUS: 0 - Asymptomatic  Vitals:   08/19/16 0841  BP: (!) 146/89  Pulse: (!) 55  Resp: 18  Temp: 98 F (36.7 C)   Filed Weights   08/19/16 0841  Weight: 165 lb 1.6 oz (74.9 kg)    GENERAL:alert, no distress and comfortable SKIN: skin color, texture, turgor are normal, no rashes or significant lesions EYES: normal, Conjunctiva are pink and non-injected, sclera clear Musculoskeletal:no cyanosis of digits and no clubbing  NEURO: alert & oriented x 3 with fluent speech, no focal motor/sensory deficits  LABORATORY DATA:  I have reviewed the data as listed    Component Value Date/Time   NA 144 08/14/2016 0750   K 4.3 08/14/2016 0750   CL 109 (H) 03/14/2013 0835   CO2 25 08/14/2016 0750   GLUCOSE 89 08/14/2016 0750   GLUCOSE 117 (H) 03/14/2013 0835   BUN 23.9 08/14/2016 0750   CREATININE 1.4 (H) 08/14/2016 0750   CALCIUM 10.4 08/14/2016 0750   PROT 7.1 08/14/2016 0750   PROT 7.8 08/14/2016 0750    ALBUMIN 4.3 08/14/2016 0750   AST 37 (H) 08/14/2016 0750   ALT 28 08/14/2016 0750   ALKPHOS 34 (L) 08/14/2016 0750   BILITOT 0.85 08/14/2016 0750   GFRNONAA 60 (L) 11/10/2012 0513   GFRAA 69 (L) 11/10/2012 0513    No results found for: SPEP, UPEP  Lab Results  Component Value Date   WBC 4.0 08/14/2016   NEUTROABS 2.0 08/14/2016   HGB 13.4 08/14/2016   HCT 41.2 08/14/2016   MCV 85.9 08/14/2016   PLT 180 08/14/2016      Chemistry      Component Value Date/Time   NA 144 08/14/2016 0750   K 4.3 08/14/2016 0750   CL 109 (H) 03/14/2013 0835   CO2 25 08/14/2016 0750   BUN 23.9 08/14/2016 0750   CREATININE 1.4 (H) 08/14/2016 0750      Component Value Date/Time   CALCIUM 10.4 08/14/2016 0750   ALKPHOS 34 (L) 08/14/2016 0750   AST 37 (H) 08/14/2016 0750   ALT 28 08/14/2016 0750   BILITOT 0.85 08/14/2016 0750       ASSESSMENT & PLAN:  MULTIPLE MYELOMA, IN REMISSION The patient remain in remission. He has been in remission 10 years since his transplant. I recommend history, physical examination, & blood work and to defer, 24-hour urine collection and skeletal survey. In the meantime, he will continue close followup with his primary care provider. I recommend yearly influenza vaccination.  Chronic kidney disease, stage III (moderate) he will continue current medical management. His kidney function has been stable.  Coronary atherosclerosis of artery bypass graft The patient had multiple recent bleeding complications. Recently, he had a 4 hour bleeding from accidental injury to his tongue. I recommend he consult with his cardiologist consideration of discontinuation of Plavix since he has no further signs and symptoms to suggest active angina/heart disease   Orders Placed This Encounter  Procedures  . CBC with Differential/Platelet    Standing Status:   Future    Standing Expiration Date:   09/23/2017  . Comprehensive metabolic panel    Standing Status:   Future     Standing Expiration Date:   09/23/2017  . Kappa/lambda light chains    Standing Status:   Future    Standing Expiration Date:   09/23/2017  . Multiple Myeloma Panel (SPEP&IFE w/QIG)    Standing Status:   Future    Standing Expiration Date:   09/23/2017   All questions were answered. The patient knows to call the clinic with any problems, questions or concerns. No barriers to learning was detected. I spent 15 minutes counseling the patient face to face. The total time spent in the appointment was 20 minutes and more than 50% was on counseling and review of test results     [facility], Lacrisha Bielicki, MD 08/19/2016 10:14 AM

## 2016-08-19 NOTE — Assessment & Plan Note (Signed)
The patient had multiple recent bleeding complications. Recently, he had a 4 hour bleeding from accidental injury to his tongue. I recommend he consult with his cardiologist consideration of discontinuation of Plavix since he has no further signs and symptoms to suggest active angina/heart disease

## 2016-08-19 NOTE — Telephone Encounter (Signed)
Gave patient appointments for August 2018. Patient declined printout.

## 2017-08-13 ENCOUNTER — Other Ambulatory Visit (HOSPITAL_BASED_OUTPATIENT_CLINIC_OR_DEPARTMENT_OTHER): Payer: Medicare Other

## 2017-08-13 DIAGNOSIS — C9001 Multiple myeloma in remission: Secondary | ICD-10-CM | POA: Diagnosis not present

## 2017-08-13 LAB — CBC WITH DIFFERENTIAL/PLATELET
BASO%: 1.1 % (ref 0.0–2.0)
Basophils Absolute: 0.1 10*3/uL (ref 0.0–0.1)
EOS%: 1.2 % (ref 0.0–7.0)
Eosinophils Absolute: 0.1 10*3/uL (ref 0.0–0.5)
HCT: 42.5 % (ref 38.4–49.9)
HGB: 13.9 g/dL (ref 13.0–17.1)
LYMPH%: 32.2 % (ref 14.0–49.0)
MCH: 28.8 pg (ref 27.2–33.4)
MCHC: 32.6 g/dL (ref 32.0–36.0)
MCV: 88.4 fL (ref 79.3–98.0)
MONO#: 0.7 10*3/uL (ref 0.1–0.9)
MONO%: 13.9 % (ref 0.0–14.0)
NEUT%: 51.6 % (ref 39.0–75.0)
NEUTROS ABS: 2.5 10*3/uL (ref 1.5–6.5)
PLATELETS: 210 10*3/uL (ref 140–400)
RBC: 4.8 10*6/uL (ref 4.20–5.82)
RDW: 15.7 % — ABNORMAL HIGH (ref 11.0–14.6)
WBC: 4.9 10*3/uL (ref 4.0–10.3)
lymph#: 1.6 10*3/uL (ref 0.9–3.3)

## 2017-08-13 LAB — COMPREHENSIVE METABOLIC PANEL
ALBUMIN: 4 g/dL (ref 3.5–5.0)
ALK PHOS: 37 U/L — AB (ref 40–150)
ALT: 13 U/L (ref 0–55)
AST: 19 U/L (ref 5–34)
Anion Gap: 10 mEq/L (ref 3–11)
BILIRUBIN TOTAL: 0.65 mg/dL (ref 0.20–1.20)
BUN: 19.3 mg/dL (ref 7.0–26.0)
CALCIUM: 10.2 mg/dL (ref 8.4–10.4)
CO2: 26 mEq/L (ref 22–29)
Chloride: 105 mEq/L (ref 98–109)
Creatinine: 1.6 mg/dL — ABNORMAL HIGH (ref 0.7–1.3)
EGFR: 45 mL/min/{1.73_m2} — ABNORMAL LOW (ref >90)
Glucose: 107 mg/dl (ref 70–140)
Potassium: 4.6 mEq/L (ref 3.5–5.1)
Sodium: 141 mEq/L (ref 136–145)
TOTAL PROTEIN: 7.9 g/dL (ref 6.4–8.3)

## 2017-08-14 LAB — KAPPA/LAMBDA LIGHT CHAINS
IG LAMBDA FREE LIGHT CHAIN: 25.9 mg/L (ref 5.7–26.3)
Ig Kappa Free Light Chain: 31.3 mg/L — ABNORMAL HIGH (ref 3.3–19.4)
Kappa/Lambda FluidC Ratio: 1.21 (ref 0.26–1.65)

## 2017-08-17 LAB — MULTIPLE MYELOMA PANEL, SERUM
Albumin SerPl Elph-Mcnc: 3.8 g/dL (ref 2.9–4.4)
Albumin/Glob SerPl: 1.2 (ref 0.7–1.7)
Alpha 1: 0.3 g/dL (ref 0.0–0.4)
Alpha2 Glob SerPl Elph-Mcnc: 0.8 g/dL (ref 0.4–1.0)
B-Globulin SerPl Elph-Mcnc: 1.4 g/dL — ABNORMAL HIGH (ref 0.7–1.3)
Gamma Glob SerPl Elph-Mcnc: 0.8 g/dL (ref 0.4–1.8)
Globulin, Total: 3.4 g/dL (ref 2.2–3.9)
IGA/IMMUNOGLOBULIN A, SERUM: 340 mg/dL (ref 61–437)
IgG, Qn, Serum: 770 mg/dL (ref 700–1600)
IgM, Qn, Serum: 82 mg/dL (ref 20–172)
Total Protein: 7.2 g/dL (ref 6.0–8.5)

## 2017-08-20 ENCOUNTER — Ambulatory Visit (HOSPITAL_COMMUNITY)
Admission: RE | Admit: 2017-08-20 | Discharge: 2017-08-20 | Disposition: A | Discharge status: Home / Self Care | Payer: Medicare Other | Source: Ambulatory Visit | Attending: Hematology and Oncology | Admitting: Hematology and Oncology

## 2017-08-20 ENCOUNTER — Ambulatory Visit (HOSPITAL_BASED_OUTPATIENT_CLINIC_OR_DEPARTMENT_OTHER): Payer: Medicare Other | Admitting: Hematology and Oncology

## 2017-08-20 ENCOUNTER — Telehealth: Payer: Self-pay | Admitting: Hematology and Oncology

## 2017-08-20 ENCOUNTER — Encounter: Payer: Self-pay | Admitting: Hematology and Oncology

## 2017-08-20 VITALS — BP 132/90 | HR 59 | Temp 98.7°F | Resp 18 | Ht 69.0 in | Wt 168.6 lb

## 2017-08-20 DIAGNOSIS — M25552 Pain in left hip: Secondary | ICD-10-CM | POA: Insufficient documentation

## 2017-08-20 DIAGNOSIS — N183 Chronic kidney disease, stage 3 unspecified: Secondary | ICD-10-CM

## 2017-08-20 DIAGNOSIS — C9001 Multiple myeloma in remission: Secondary | ICD-10-CM | POA: Diagnosis not present

## 2017-08-20 NOTE — Progress Notes (Addendum)
[name] OFFICE PROGRESS NOTE  Patient Care Team: Prince Solian, MD as PCP - General (Internal Medicine) Edrick Oh, MD as Consulting Physician (Nephrology)  SUMMARY OF ONCOLOGIC HISTORY:  He presented with a one-month history of right groin pain at age 61 in March of 2004. Bone scan showed some subtle abnormalities but was nondiagnostic done 01/12/2003. He underwent an MRI of the hip which showed a 3 cm expansile lesion in the right acetabulum. He was initially evaluated at [facility]. A CT guided biopsy was positive for plasma cells. He was referred to Dr. Truddie Coco. Bone marrow biopsy done 03/12/2003 showed only 5% plasma cells. He had a mild elevation of IgA immunoglobulin at 477 mg; IgG kappa on IFE percent. Urine was negative for free light chains. Serum beta 2 microglobulin 2.38.  He underwent initial radiation 20 gray 10 fractions between 329 and 04/04/2003. He did well until February 2005 when he had recurrent right hip pain. MRI done February 17, 2004 showed a recurrent large lesion measuring 2.6 x 7.7 cm. He received additional radiation 45 gray in 25 fractions between March 28 and 04/19/2004.  In October 2005, a soft tissue lesion was noted on the lateral aspect of his left leg. This was excised in December and found to be a plasmacytoma.[name] He received local radiation 30 gray in 10 fractions between January 4 and 12/28/2004.  Due to another local recurrence in the right hip, he underwent a total right hip replacement by Dr. Wynelle Link in February 2006.  In April 2006 he developed a soft tissue lesion on the left chest wall and again received radiation 30 gray in 10 fractions between July 17 and July 28 06.  He was started on pulse dexamethasone in August 2006 and then a combination of Velcade, dexamethasone, and thalidomide beginning 09/08/2005. He received 5 cycles of treatment through 12/01/2005. He developed a steroid myopathy.  He was referred to [facility] where he underwent high dose intravenous melphalan chemotherapy 200 mg per meter squared with autologous stem cell support on 02/25/2006.  He achieved a durable remission subsequent to those treatments.  As a consequence of his myeloma and its treatments, he has developed hypogammaglobulinemia and has had problems with recurrent sinopulmonary infections. He was started on a program of monthly intravenous immunoglobulin 4 years ago with excellent results and decrease in the frequency and severity of infections. Frequency of the IVIG infusions has been subsequently decreased to every 3 months, discontinued in May 2015.  INTERVAL HISTORY: Please see below for problem oriented charting. He feels well except for new onset of left hip pain that comes and goes over the last 3 weeks He was not caused by any injury or fall It is not severe enough to warrant pain medication It does not interferes with activities of daily living He denies recent fever or chills Appetite is stable He has no other new complaints No recent infection.  REVIEW OF SYSTEMS:   Constitutional: Denies fevers, chills or abnormal weight loss Eyes: Denies blurriness of vision Ears, nose, mouth, throat, and face: Denies mucositis or sore throat Respiratory: Denies cough, dyspnea or wheezes Cardiovascular: Denies palpitation, chest discomfort or lower extremity swelling Gastrointestinal:  Denies nausea, heartburn or change in bowel habits Skin: Denies abnormal skin rashes Lymphatics: Denies new lymphadenopathy or easy bruising Neurological:Denies numbness, tingling or new weaknesses Behavioral/Psych: Mood is stable, no new changes  All other systems were reviewed with the patient and are negative.  I have reviewed  the past medical history, past surgical history, social history and family history with the patient and they are unchanged from previous note.  ALLERGIES:  has No Known Allergies.  MEDICATIONS:  Current  Outpatient Prescriptions  Medication Sig Dispense Refill  . amLODipine (NORVASC) 10 MG tablet Take 10 mg by mouth daily.    [name] aspirin 81 MG tablet Take 81 mg by mouth daily.    [name] atenolol (TENORMIN) 100 MG tablet Take 100 mg by mouth daily.    [name] atorvastatin (LIPITOR) 80 MG tablet Take 80 mg by mouth daily.    . cholecalciferol (VITAMIN D) 1000 UNITS tablet Take 2,000 Units by mouth daily.    . clopidogrel (PLAVIX) 75 MG tablet Take 1 tablet (75 mg total) by mouth daily. 30 tablet 0  . fenofibrate (LOFIBRA) 160 MG tablet Take 160 mg by mouth daily.    [name] loratadine (CLARITIN) 10 MG tablet Take 10 mg by mouth daily.    [name] NITROSTAT 0.4 MG SL tablet     . NON FORMULARY IV Immunoglobulin quarterly    . omeprazole (PRILOSEC) 20 MG capsule Take 20 mg by mouth daily.    . pantoprazole (PROTONIX) 40 MG tablet     . zolpidem (AMBIEN) 10 MG tablet Take 10 mg by mouth at bedtime as needed.     No current facility-administered medications for this visit.     PHYSICAL EXAMINATION: ECOG PERFORMANCE STATUS: 1 - Symptomatic but completely ambulatory  Vitals:   08/20/17 0837  BP: 132/90  Pulse: (!) 59  Resp: 18  Temp: 98.7 F (37.1 C)  SpO2: 100%   Filed Weights   08/20/17 0837  Weight: 168 lb 9.6 oz (76.5 kg)    GENERAL:alert, no distress and comfortable SKIN: skin color, texture, turgor are normal, no rashes or significant lesions EYES: normal, Conjunctiva are pink and non-injected, sclera clear OROPHARYNX:no exudate, no erythema and lips, buccal mucosa, and tongue normal  NECK: supple, thyroid normal size, non-tender, without nodularity LYMPH:  no palpable lymphadenopathy in the cervical, axillary or inguinal LUNGS: clear to auscultation and percussion with normal breathing effort HEART: regular rate & rhythm and no murmurs and no lower extremity edema ABDOMEN:abdomen soft, non-tender and normal bowel sounds Musculoskeletal:no cyanosis of digits and no clubbing  NEURO: alert & oriented  x 3 with fluent speech, no focal motor/sensory deficits  LABORATORY DATA:  I have reviewed the data as listed    Component Value Date/Time   NA 141 08/13/2017 0739   K 4.6 08/13/2017 0739   CL 109 (H) 03/14/2013 0835   CO2 26 08/13/2017 0739   GLUCOSE 107 08/13/2017 0739   GLUCOSE 117 (H) 03/14/2013 0835   BUN 19.3 08/13/2017 0739   CREATININE 1.6 (H) 08/13/2017 0739   CALCIUM 10.2 08/13/2017 0739   PROT 7.9 08/13/2017 0739   PROT 7.2 08/13/2017 0739   ALBUMIN 4.0 08/13/2017 0739   AST 19 08/13/2017 0739   ALT 13 08/13/2017 0739   ALKPHOS 37 (L) 08/13/2017 0739   BILITOT 0.65 08/13/2017 0739   GFRNONAA 60 (L) 11/10/2012 0513   GFRAA 69 (L) 11/10/2012 0513    No results found for: SPEP, UPEP  Lab Results  Component Value Date   WBC 4.9 08/13/2017   NEUTROABS 2.5 08/13/2017   HGB 13.9 08/13/2017   HCT 42.5 08/13/2017   MCV 88.4 08/13/2017   PLT 210 08/13/2017      Chemistry      Component Value Date/Time   NA 141 08/13/2017  0739   K 4.6 08/13/2017 0739   CL 109 (H) 03/14/2013 0835   CO2 26 08/13/2017 0739   BUN 19.3 08/13/2017 0739   CREATININE 1.6 (H) 08/13/2017 0739      Component Value Date/Time   CALCIUM 10.2 08/13/2017 0739   ALKPHOS 37 (L) 08/13/2017 0739   AST 19 08/13/2017 0739   ALT 13 08/13/2017 0739   BILITOT 0.65 08/13/2017 0739     ASSESSMENT & PLAN:  MULTIPLE MYELOMA, IN REMISSION The patient remain in remission. He has been in remission 10 years since his transplant. I recommend history, physical examination, & blood work and to defer, 24-hour urine collection and skeletal survey. In the meantime, he will continue close followup with his primary care provider. I recommend yearly influenza vaccination. Recent myeloma panel is unremarkable  Chronic kidney disease, stage III (moderate) he will continue current medical management. His kidney function has been stable.  Left hip pain He had mild left hip pain The cause is unknown but could  be due to degenerative arthritis I will order x-ray evaluation today If x-ray is unremarkable, I will refer him back to see his orthopedic surgeon for further management   Orders Placed This Encounter  Procedures  . DG HIP UNILAT WITH PELVIS 2-3 VIEWS LEFT    Standing Status:   Future    Number of Occurrences:   1    Standing Expiration Date:   10/20/2018    Order Specific Question:   Reason for Exam (SYMPTOM  OR DIAGNOSIS REQUIRED)    Answer:   MGUS, new left hip pain, exclude myelomaa    Order Specific Question:   Preferred imaging location?    Answer:   [facility]    Order Specific Question:   Radiology Contrast Protocol - do NOT remove file path    Answer:   [email or web address]   All questions were answered. The patient knows to call the clinic with any problems, questions or concerns. No barriers to learning was detected. I spent 15 minutes counseling the patient face to face. The total time spent in the appointment was 20 minutes and more than 50% was on counseling and review of test results  X-ray result was available which show mild degenerative narrowing of joint space We discussed options including referral to orthopedic doctor versus physical therapy/rehab He elects for physical therapy/rehab first I will send the referral.    Heath Lark, MD 08/20/2017 9:13 AM

## 2017-08-20 NOTE — Telephone Encounter (Signed)
Spoke with patient about his upcoming appts. Did not want avs or calendar.

## 2017-08-20 NOTE — Assessment & Plan Note (Signed)
He had mild left hip pain The cause is unknown but could be due to degenerative arthritis I will order x-ray evaluation today If x-ray is unremarkable, I will refer him back to see his orthopedic surgeon for further management

## 2017-08-20 NOTE — Addendum Note (Signed)
Addended byAlvy Bimler, Trelon Plush on: 08/20/2017 09:42 AM   Modules accepted: Orders, SmartSet

## 2017-08-20 NOTE — Assessment & Plan Note (Signed)
he will continue current medical management. His kidney function has been stable.

## 2017-08-20 NOTE — Assessment & Plan Note (Signed)
The patient remain in remission. He has been in remission 10 years since his transplant. I recommend history, physical examination, & blood work and to defer, 24-hour urine collection and skeletal survey. In the meantime, he will continue close followup with his primary care provider. I recommend yearly influenza vaccination. Recent myeloma panel is unremarkable

## 2017-09-02 ENCOUNTER — Ambulatory Visit: Payer: Medicare Other | Admitting: Physical Therapy

## 2017-09-15 ENCOUNTER — Ambulatory Visit: Payer: Medicare Other | Attending: Hematology and Oncology | Admitting: Physical Therapy

## 2017-09-15 DIAGNOSIS — M25651 Stiffness of right hip, not elsewhere classified: Secondary | ICD-10-CM | POA: Insufficient documentation

## 2017-09-15 DIAGNOSIS — M25552 Pain in left hip: Secondary | ICD-10-CM | POA: Diagnosis present

## 2017-09-15 DIAGNOSIS — M6281 Muscle weakness (generalized): Secondary | ICD-10-CM | POA: Diagnosis present

## 2017-09-15 DIAGNOSIS — M25652 Stiffness of left hip, not elsewhere classified: Secondary | ICD-10-CM | POA: Diagnosis not present

## 2017-09-15 NOTE — Therapy (Signed)
[place], [place], [address] Phone: [phone]   Fax:  [phone]  Physical Therapy Evaluation  Patient Details  Name: [name] MRN: [id number] Date of Birth: [birth year] Referring Provider: Dr. Heath Lark  Encounter Date: 09/15/2017      PT End of Session - 09/15/17 1705    Visit Number 1   Number of Visits 3   Date for PT Re-Evaluation 10/21/17   PT Start Time 1300   PT Stop Time 1350   PT Time Calculation (min) 50 min   Activity Tolerance Patient tolerated treatment well   Behavior During Therapy [facility] for tasks assessed/performed      Past Medical History:  Diagnosis Date  . B12 deficiency   . CAD (coronary artery disease)   . Chronic renal insufficiency   . Diverticulitis   . GERD (gastroesophageal reflux disease)   . Gout   . HTN (hypertension)   . Hx of stem cell transplant ([place]) 02/2006  . Hypercholesteremia   . Hypogammaglobulinemia, acquired ([place]) 01/21/2013   Due to high doe chemo for myeloma  . MI (myocardial infarction) [facility])    MI June 2012  . multiple myeloma dx'd 2005   chemo/xrt comp 2006  . Peripheral neuropathy    chronic  . Peripheral neuropathy, secondary to drugs or chemicals 02/27/2014   Due to chemotherapy given for myeloma  . Personal history of colonic polyps 11/01/2012   2006 - 6 adenomas max 6 mm ([place]) 2009 - inflammatory polyps x 2 11/01/2012 sigmoid 10 mm and rectal 8 mm polyps - retention polyps    . Plasmocytoma ([place]) dx'd 2007   IVIG treatment in progress  . Recurrent infections    after stem cell transplant - on Iv IgG  . Right leg pain 10/27/2014  . Rosacea     Past Surgical History:  Procedure Laterality Date  . COLONOSCOPY W/ POLYPECTOMY    . CORONARY ANGIOPLASTY WITH STENT PLACEMENT  05/2011   Dr. Quillian Quince in [facility]  . CORONARY ARTERY BYPASS GRAFT  08/19/1993   [facility] in [place], [place]  11/09/2012    Procedure: FLEXIBLE SIGMOIDOSCOPY;  Surgeon: Milus Banister, MD;  Location: [facility];  Service: Endoscopy;  Laterality: N/A;  . HERNIA REPAIR  09/2010   left, Dr. Donne Hazel  . LASIK  10/2009   Dr. Gershon Crane  . stent placement for hydronephrosis  11/2004   Dr. Diona Fanti  . TOTAL HIP ARTHROPLASTY  02/03/2005   right    There were no vitals filed for this visit.       Subjective Assessment - 09/15/17 1306    Subjective "I was having some pain in my left hip. It sort of put a scare into me. Thirteen years ago I had pain in my right hip which turned out to be multiple myeloma.  I had chemo and radiation, and then I had a hip replacement." I went to Dr. Alvy Bimler; she suggested some x-rays. "I don't see any cancer there. You can go see the orthopedic surgeon or go for some physical therapy."   Pertinent History Right hip pain led to diagnosis of multiple myeloma; had chemo and radiation, then hip replacement, then stem cell transplant.  Has been in remission for 10 years. Triple bypass in 1994 and slight heart attack in 2012 with stents placed.  HTN. He says his cardiac health is good now.  He goes to the [employer].  Has had bilateral hernia operations for inguinal hernias. Kidney stents > 10 years ago.   Patient Stated Goals get left hip feeling better   Currently in Pain? Yes   Pain Score 0-No pain  up to 4   Pain Location Hip   Pain Orientation Left   Pain Descriptors / Indicators Other (Comment)  annoyance   Pain Onset More than a month ago  5-6 weeks   Aggravating Factors  nothing   Pain Relieving Factors nothing   Multiple Pain Sites Yes   Pain Score 0  up to 9/10   Pain Location Hip   Pain Orientation Right   Pain Descriptors / Indicators Other (Comment)  a shock   Pain Type Acute pain   Pain Onset More than a month ago  maybe a year   Pain Frequency Intermittent   Aggravating Factors  nothing   Pain Relieving Factors nothing   Pain Location  Foot  1st and 2nd toes   Pain Orientation Right;Left   Pain Descriptors / Indicators Cramping   Pain Frequency Intermittent  every 3-5 days   Aggravating Factors  nothing   Pain Relieving Factors just wait for time to pass and make it go away            [facility] PT Assessment - 09/15/17 0001      Assessment   Medical Diagnosis h/o multiple myeloma in remission, now with left hip pain   Referring Provider Dr. Heath Lark   Onset Date/Surgical Date --  Rt. THA 2005   Hand Dominance Right   Prior Therapy none     Precautions   Precautions None     Restrictions   Weight Bearing Restrictions No     Balance Screen   Has the patient fallen in the past 6 months No   Has the patient had a decrease in activity level because of a fear of falling?  No   Is the patient reluctant to leave their home because of a fear of falling?  No     Home Environment   Living Environment Private residence   Living Arrangements Spouse/significant other   Type of [place] One level     Prior Function   Level of [place] On disability   Leisure Tries to go 3-4 times/week to [employer].  Does treadmill (at least 30-45 minutes at 3.5-3.6 mpg), sometimes elliptical, stretches.     Cognition   Overall Cognitive Status Within Functional Limits for tasks assessed     ROM / Strength   AROM / PROM / Strength AROM;Strength     AROM   AROM Assessment Site Hip   Right/Left Hip Right;Left   Right Hip Flexion --  in sitting, ltd. slightly compared to left   Right Hip External Rotation  25  supine   Right Hip Internal Rotation  20   Right Hip ABduction 30  supine   Left Hip Flexion --  in sitting, WFL   Left Hip External Rotation  40  supine   Left Hip Internal Rotation  15   Left Hip ABduction 30  supine     Strength   Overall Strength Comments knee flexion strength 5/5 bilat.; quads Rt. 5/5, Lt. 4+/5   Strength Assessment  Site Hip   Right/Left Hip Right;Left   Right Hip Extension 4/5   Right Hip External Rotation  4-/5   Right Hip Internal Rotation 4/5  Right Hip ABduction 4+/5   Right Hip ADduction 3/5   Left Hip Extension 4-/5   Left Hip External Rotation 4+/5   Left Hip Internal Rotation 4+/5  painful   Left Hip ABduction 4+/5   Left Hip ADduction 4+/5     Special Tests    Special Tests Hip Special Tests   Hip Special Tests  Marcello Moores Test;Ober's Test;Hip Scouring     Thomas Test    Findings Positive   Side Left   Comments mild tightness-tight with flexing knee     Ober's Test   Findings Negative   Side Left     Hip Scouring   Findings Positive   Side Left     Ambulation/Gait   Ambulation/Gait Yes   Ambulation/Gait Assistance 7: Independent  by his report   Stairs Yes  not observed; says he holds railings at times   Gait Comments normal gait pattern            Objective measurements completed on examination: See above findings.                          Long Term Clinic Goals - 09/15/17 1712      CC Long Term Goal  #1   Title Patient will be independent with stretching exercises for both hips (focus on left).   Time 2   Period Weeks   Status New     CC Long Term Goal  #2   Title Pt. will be knowledgeable about appropriate left hip strengthening exercises to do at his gym.   Time 2   Period Weeks   Status New     CC Long Term Goal  #3   Title Pt. will report at follow-up that he has been able to implement recommended exercises and note at least 25% decrease in left hip pain.   Time 5   Period Weeks   Status New             Plan - 09/15/17 1705    Clinical Impression Statement This is a pleasant gentleman with past h/o multiple myeloma and in remission x 10 years.  His multiple myeloma was diagnosed through workup for right hip pain, so when left hip pain recently developed, he was concerned about possible recurrence.  He had hip x-rayed  and Dr. Alvy Bimler said it was not cancer.  He come in today for assistance to improve hip pain.  He does show some bilateral hip tightness and weakness (had total hip replacement on right years ago).    History and Personal Factors relevant to plan of care: h/o multiple myeloma, significant cardiac history including mild MI   Clinical Presentation Evolving   Clinical Presentation due to: newer left hip pain   Clinical Decision Making Moderate   Rehab Potential Good   PT Frequency --  2 visits, 1 for education and 1 for follow-up   PT Duration 6 weeks   PT Treatment/Interventions ADLs/Self Care Home Management;Therapeutic exercise;Patient/family education;Passive range of motion   PT Next Visit Plan Teach patient stretches for bilat. hip flexiblity with emphasis on rotation and left hip flexors/quad stretch; teach about appropriate strengthening to do at his gym workouts. Have him return about a month later to check on progress.   Consulted and Agree with Plan of Care Patient      Patient will benefit from skilled therapeutic intervention in order to improve the following deficits and impairments:  Decreased  range of motion, Decreased strength, Pain  Visit Diagnosis: Stiffness of left hip, not elsewhere classified - Plan: PT plan of care cert/re-cert  Stiffness of right hip, not elsewhere classified - Plan: PT plan of care cert/re-cert  Muscle weakness (generalized) - Plan: PT plan of care cert/re-cert  Pain in left hip - Plan: PT plan of care cert/re-cert      G-Codes - 04/88/89 1714    Functional Assessment Tool Used (Outpatient Only) clinical judgement   Functional Limitation Self care  self-care/management for left hip pain and limitations   Self Care Current Status (V6945) At least 40 percent but less than 60 percent impaired, limited or restricted   Self Care Goal Status (W3888) At least 1 percent but less than 20 percent impaired, limited or restricted       Problem  List Patient Active Problem List   Diagnosis Date Noted  . Left hip pain 08/20/2017  . Chronic kidney disease, stage III (moderate) 08/20/2015  . Right leg pain 10/27/2014  . Acute on chronic renal failure ([place]) 05/16/2014  . Peripheral neuropathy, secondary to drugs or chemicals 02/27/2014  . Hypogammaglobulinemia, acquired ([place]) 01/21/2013  . Rectal bleeding 11/09/2012  . Personal history of colonic polyps 11/01/2012  . ALLERGIC RHINITIS CAUSE UNSPECIFIED 11/24/2007  . MULTIPLE MYELOMA, IN REMISSION 11/09/2007  . HYPERTENSION 11/09/2007  . Coronary atherosclerosis of artery bypass graft 11/09/2007  . INSOMNIA 11/09/2007  . HIP REPLACEMENT, RIGHT, HX OF 11/09/2007    Arye Weyenberg 09/15/2017, 5:17 PM  [place], [place], [address] Phone: [phone]   Fax:  [phone]  Name: [name] MRN: [id number] Date of Birth: [birth year]  Serafina Royals, PT 09/15/17 5:17 PM

## 2017-09-28 ENCOUNTER — Ambulatory Visit: Payer: Medicare Other | Attending: Hematology and Oncology | Admitting: Physical Therapy

## 2017-09-28 DIAGNOSIS — M25651 Stiffness of right hip, not elsewhere classified: Secondary | ICD-10-CM | POA: Diagnosis present

## 2017-09-28 DIAGNOSIS — M6281 Muscle weakness (generalized): Secondary | ICD-10-CM | POA: Diagnosis present

## 2017-09-28 DIAGNOSIS — M25652 Stiffness of left hip, not elsewhere classified: Secondary | ICD-10-CM | POA: Diagnosis not present

## 2017-09-28 DIAGNOSIS — M25552 Pain in left hip: Secondary | ICD-10-CM

## 2017-09-28 NOTE — Patient Instructions (Addendum)
Iliotibial Band Stretch, Standing    Stand, one hand on wall, same-side leg behind other leg. Lean hips toward wall, bending front knee, keeping back knee straight. Hold ___ seconds. Change foot position, lean to same-side. Hold ___ seconds. Repeat ___ times per session. Do ___ sessions per day.  Copyright  VHI. All rights reserved.  Flexors, Kneeling    Kneel on one leg. Slowly push pelvis down while slightly arching back until stretch is felt on front of hip. Hold __30_ seconds.  Repeat _1-2__ times per session. Do _1__ sessions per day.  Copyright  VHI. All rights reserved.  Piriformis Stretch, Supine    Lie supine, one ankle crossed onto opposite knee. Holding bottom leg behind knee, gently pull legs toward chest until stretch is felt in buttock of top leg. Hold _3-__ seconds. For deeper stretch gently push top knee away from body.  Repeat _1-2__ times per session. Do _1__ sessions per day.  Copyright  VHI. All rights reserved.  Extensors / Rotators, Supine    Lie supine, one leg straight, other leg bent, knee held by opposite hand. Pull leg across body toward floor. Hold __30_ seconds.  Repeat _1-2__ times per session. Do _1__ sessions per day.  Copyright  VHI. All rights reserved.  Chair Sitting    Sit at edge of seat, spine straight, one leg extended. Put a hand on each thigh and bend forward from the hip, keeping spine straight. Allow hand on extended leg to reach toward toes. Support upper body with other arm. Hold _30__ seconds. Repeat __1-2_ times per session. Do _1__ sessions per day.  Copyright  VHI. All rights reserved.    FOR HIP STRENGTHENING, USE THESE MACHINES:  1) Leg press 2) Cybex or other hip machine, moving leg in forward and sideways directions especially; could also do backward direction. 3) Alternatively, you could do resisted walking in all four directions:  Forward, backward, to the right and to the left.

## 2017-09-28 NOTE — Therapy (Addendum)
[facility] [address] [place], [place], [address] Phone: [phone]   Fax:  [phone]  Physical Therapy Treatment  Patient Details  Name: [name] MRN: [id number] Date of Birth: [birth year] Referring Provider: Dr. Artis Delay  Encounter Date: 09/28/2017      PT End of Session - 09/28/17 1122    Visit Number 2   Number of Visits 3   Date for PT Re-Evaluation 10/21/17   PT Start Time 0845   PT Stop Time 0929   PT Time Calculation (min) 44 min   Activity Tolerance Patient tolerated treatment well   Behavior During Therapy [facility] for tasks assessed/performed      Past Medical History:  Diagnosis Date  . B12 deficiency   . CAD (coronary artery disease)   . Chronic renal insufficiency   . Diverticulitis   . GERD (gastroesophageal reflux disease)   . Gout   . HTN (hypertension)   . Hx of stem cell transplant ([facility]) 02/2006  . Hypercholesteremia   . Hypogammaglobulinemia, acquired ([facility]) 01/21/2013   Due to high doe chemo for myeloma  . MI (myocardial infarction) [facility])    MI June 2012  . multiple myeloma dx'd 2005   chemo/xrt comp 2006  . Peripheral neuropathy    chronic  . Peripheral neuropathy, secondary to drugs or chemicals 02/27/2014   Due to chemotherapy given for myeloma  . Personal history of colonic polyps 11/01/2012   2006 - 6 adenomas max 6 mm ([place]) 2009 - inflammatory polyps x 2 11/01/2012 sigmoid 10 mm and rectal 8 mm polyps - retention polyps    . Plasmocytoma ([facility]) dx'd 2007   IVIG treatment in progress  . Recurrent infections    after stem cell transplant - on Iv IgG  . Right leg pain 10/27/2014  . Rosacea     Past Surgical History:  Procedure Laterality Date  . COLONOSCOPY W/ POLYPECTOMY    . CORONARY ANGIOPLASTY WITH STENT PLACEMENT  05/2011   Dr. Reuel Boom in [facility]  . CORONARY ARTERY BYPASS GRAFT  08/19/1993   [facility] in [place], [place]  . [place] SIGMOIDOSCOPY  11/09/2012   Procedure: FLEXIBLE SIGMOIDOSCOPY;  Surgeon: Rachael Fee, MD;  Location: [facility];  Service: Endoscopy;  Laterality: N/A;  . HERNIA REPAIR  09/2010   left, Dr. Dwain Sarna  . LASIK  10/2009   Dr. Nile Riggs  . stent placement for hydronephrosis  11/2004   Dr. Retta Diones  . TOTAL HIP ARTHROPLASTY  02/03/2005   right    There were no vitals filed for this visit.      Subjective Assessment - 09/28/17 0848    Subjective "Everything's good."   Currently in Pain? No/denies                         [facility] Adult PT Treatment/Exercise - 09/28/17 0001      Knee/Hip Exercises: Machines for Strengthening   Other Machine Looked at gym equipment and talked about options for patient to do at his gym [employer] forces [employer] education [employer]).  Suggested leg press, Cybex hip, and/or resisted walking.     Knee/Hip Exercises: Supine   Other Supine Knee/Hip Exercises cross left knee over right leg for stretch into adduction/internal rotation 30 seconds x 2; hip flexor stretch over edge of mat with knee flexion, 30 seconds x 1; piriformis stretch (figure 4) x 30 seconds; left ITB stretch x 30 seconds; seated hamstring stretch x 30  seconds; then kneeling left hip flexor stretch                PT Education - 09/28/17 1121    Education provided Yes   Education Details multiple hip stretches and hip strengthening options discussed for patient to follow through with at his gym   Person(s) Educated Patient   Methods Explanation;Demonstration;Handout   Comprehension Verbalized understanding;Returned demonstration                [place] - 09/28/17 1128      CC Long Term Goal  #1   Title Patient will be independent with stretching exercises for both hips (focus on left).   Status Partially Met     CC Long Term Goal  #2   Title Pt. will be knowledgeable about appropriate left hip strengthening exercises to do at his gym.   Status Partially Met     CC Long  Term Goal  #3   Title Pt. will report at follow-up that he has been able to implement recommended exercises and note at least 25% decrease in left hip pain.   Status On-going            Plan - 09/28/17 1123    Clinical Impression Statement Pt. was given stretches and strengthening suggestions for his left hip to follow through with at his gym.  He was told he could do stretches on the right as well, but gently.  he plans to come back in 3 months after working on this program, to follow up and check his status.   Rehab Potential Good   PT Frequency --  1 follow-up visit in 3 months   PT Duration 12 weeks   PT Treatment/Interventions ADLs/Self Care Home Management;Therapeutic exercise;Patient/family education;Passive range of motion   PT Next Visit Plan Pt. to return in 3 months for follow-up.  Check whether hip pain has improved and how his exercise program is going as well as whether he needs advanced exercise suggestions.  Check goals.   PT Home Exercise Plan hip stretches and strengthening (see HEP given 09/28/17)   Consulted and Agree with Plan of Care Patient      Patient will benefit from skilled therapeutic intervention in order to improve the following deficits and impairments:  Decreased range of motion, Decreased strength, Pain  Visit Diagnosis: Stiffness of left hip, not elsewhere classified  Stiffness of right hip, not elsewhere classified  Muscle weakness (generalized)  Pain in left hip     Problem List Patient Active Problem List   Diagnosis Date Noted  . Left hip pain 08/20/2017  . Chronic kidney disease, stage III (moderate) ([place]) 08/20/2015  . Right leg pain 10/27/2014  . Acute on chronic renal failure ([place]) 05/16/2014  . Peripheral neuropathy, secondary to drugs or chemicals 02/27/2014  . Hypogammaglobulinemia, acquired ([place]) 01/21/2013  . Rectal bleeding 11/09/2012  . Personal history of colonic polyps 11/01/2012  . ALLERGIC RHINITIS CAUSE UNSPECIFIED  11/24/2007  . MULTIPLE MYELOMA, IN REMISSION 11/09/2007  . HYPERTENSION 11/09/2007  . Coronary atherosclerosis of artery bypass graft 11/09/2007  . INSOMNIA 11/09/2007  . HIP REPLACEMENT, RIGHT, HX OF 11/09/2007    Ramsey Guadamuz 09/28/2017, 11:29 AM  [place], [place], [address] Phone: [phone]   Fax:  [phone]  Name: Detric Scalisi MRN: [id number] Date of Birth: 1956-08-21  Serafina Royals, PT 09/28/17 11:29 AM  PHYSICAL THERAPY DISCHARGE SUMMARY  Visits from Start of Care:  2  Current functional level related to goals / functional outcomes: Goals were partially met at the time of this visit.   Remaining deficits: Unknown: The patient was to return in three months following this visit to see how he had done with the home exercise instruction he was given.  Six months have now gone by and he has not returned.   Education / Equipment: Home exercise program for stretching and strengthening.  Plan: Patient agrees to discharge.  Patient goals were partially met. Patient is being discharged due to not returning since the last visit.    Serafina Royals, PT 04/01/18 2:34 PM ?????

## 2017-12-18 ENCOUNTER — Encounter: Payer: Self-pay | Admitting: Internal Medicine

## 2017-12-28 ENCOUNTER — Ambulatory Visit: Payer: Medicare Other | Admitting: Physical Therapy

## 2018-03-10 ENCOUNTER — Telehealth: Payer: Self-pay | Admitting: *Deleted

## 2018-03-10 NOTE — Telephone Encounter (Signed)
-----   Message from Heath Lark, MD sent at 03/10/2018  8:37 AM EDT ----- Regarding: call him later in the evening He sent me an email which I did not reply, saying he is not feeling well and has appt with PCP today. I would recommend PCP work up first. And remind him that I do not respond to my cone email. Best way to reach [place] is through the operator or mychart (expect 48 hours delay with mychart)

## 2018-03-10 NOTE — Telephone Encounter (Signed)
Notified of message below- verbalized understanding.  Pt reports he saw PCP and had CXR. Has pneumonia on right side. Started cefdinir BID X 10 days, prednisone BID X 4 days and mucinex.

## 2018-03-30 MED ORDER — LACTATED RINGERS IV SOLN
INTRAVENOUS | Status: DC
Start: ? — End: 2018-03-30

## 2018-03-30 MED ORDER — HYDROCODONE-ACETAMINOPHEN 5-325 MG PO TABS
1.00 | ORAL_TABLET | ORAL | Status: DC
Start: ? — End: 2018-03-30

## 2018-03-30 MED ORDER — PANTOPRAZOLE SODIUM 40 MG PO TBEC
40.00 | DELAYED_RELEASE_TABLET | ORAL | Status: DC
Start: 2018-03-31 — End: 2018-03-30

## 2018-03-30 MED ORDER — ACETAMINOPHEN 325 MG PO TABS
325.00 | ORAL_TABLET | ORAL | Status: DC
Start: 2018-03-30 — End: 2018-03-30

## 2018-03-30 MED ORDER — GENERIC EXTERNAL MEDICATION
2.00 | Status: DC
Start: 2018-03-30 — End: 2018-03-30

## 2018-03-30 MED ORDER — ENOXAPARIN SODIUM 30 MG/0.3ML ~~LOC~~ SOLN
30.00 | SUBCUTANEOUS | Status: DC
Start: 2018-03-30 — End: 2018-03-30

## 2018-03-30 MED ORDER — POLYETHYLENE GLYCOL 3350 17 G PO PACK
17.00 g | PACK | ORAL | Status: DC
Start: 2018-03-31 — End: 2018-03-30

## 2018-03-30 MED ORDER — MUPIROCIN 2 % EX OINT
TOPICAL_OINTMENT | CUTANEOUS | Status: DC
Start: 2018-03-30 — End: 2018-03-30

## 2018-03-30 MED ORDER — SENNOSIDES-DOCUSATE SODIUM 8.6-50 MG PO TABS
2.00 | ORAL_TABLET | ORAL | Status: DC
Start: 2018-03-30 — End: 2018-03-30

## 2018-03-30 MED ORDER — ASPIRIN 81 MG PO CHEW
81.00 | CHEWABLE_TABLET | ORAL | Status: DC
Start: 2018-03-30 — End: 2018-03-30

## 2018-03-30 MED ORDER — GABAPENTIN 100 MG PO CAPS
100.00 | ORAL_CAPSULE | ORAL | Status: DC
Start: 2018-03-30 — End: 2018-03-30

## 2018-03-30 MED ORDER — ONDANSETRON HCL 4 MG/2ML IJ SOLN
4.00 | INTRAMUSCULAR | Status: DC
Start: ? — End: 2018-03-30

## 2018-03-30 MED ORDER — ROSUVASTATIN CALCIUM 20 MG PO TABS
40.00 | ORAL_TABLET | ORAL | Status: DC
Start: 2018-03-30 — End: 2018-03-30

## 2018-07-30 ENCOUNTER — Telehealth: Payer: Self-pay | Admitting: Hematology and Oncology

## 2018-07-30 NOTE — Telephone Encounter (Signed)
Returned patients call to r/s appts.

## 2018-08-10 ENCOUNTER — Telehealth: Payer: Self-pay | Admitting: Hematology and Oncology

## 2018-08-10 NOTE — Telephone Encounter (Signed)
Patient called to reschedule due to circumstance it was ok with MD

## 2018-08-20 ENCOUNTER — Other Ambulatory Visit: Payer: Medicare Other

## 2018-08-27 ENCOUNTER — Ambulatory Visit: Payer: Medicare Other | Admitting: Hematology and Oncology

## 2018-08-30 ENCOUNTER — Other Ambulatory Visit: Payer: Self-pay

## 2018-08-30 DIAGNOSIS — C9001 Multiple myeloma in remission: Secondary | ICD-10-CM

## 2018-08-31 ENCOUNTER — Telehealth: Payer: Self-pay | Admitting: Hematology and Oncology

## 2018-08-31 ENCOUNTER — Inpatient Hospital Stay: Payer: Medicare Other | Attending: Hematology and Oncology

## 2018-08-31 ENCOUNTER — Inpatient Hospital Stay (HOSPITAL_BASED_OUTPATIENT_CLINIC_OR_DEPARTMENT_OTHER): Payer: Medicare Other | Admitting: Hematology and Oncology

## 2018-08-31 ENCOUNTER — Encounter: Payer: Self-pay | Admitting: Hematology and Oncology

## 2018-08-31 VITALS — BP 85/61 | HR 77 | Temp 97.7°F | Resp 17

## 2018-08-31 DIAGNOSIS — G8220 Paraplegia, unspecified: Secondary | ICD-10-CM

## 2018-08-31 DIAGNOSIS — Z79899 Other long term (current) drug therapy: Secondary | ICD-10-CM

## 2018-08-31 DIAGNOSIS — I959 Hypotension, unspecified: Secondary | ICD-10-CM | POA: Diagnosis not present

## 2018-08-31 DIAGNOSIS — C9001 Multiple myeloma in remission: Secondary | ICD-10-CM

## 2018-08-31 DIAGNOSIS — N319 Neuromuscular dysfunction of bladder, unspecified: Secondary | ICD-10-CM | POA: Insufficient documentation

## 2018-08-31 DIAGNOSIS — Z9221 Personal history of antineoplastic chemotherapy: Secondary | ICD-10-CM | POA: Diagnosis not present

## 2018-08-31 DIAGNOSIS — D649 Anemia, unspecified: Secondary | ICD-10-CM | POA: Diagnosis not present

## 2018-08-31 DIAGNOSIS — Z7982 Long term (current) use of aspirin: Secondary | ICD-10-CM | POA: Diagnosis not present

## 2018-08-31 DIAGNOSIS — Z66 Do not resuscitate: Secondary | ICD-10-CM | POA: Insufficient documentation

## 2018-08-31 DIAGNOSIS — Z23 Encounter for immunization: Secondary | ICD-10-CM

## 2018-08-31 DIAGNOSIS — D638 Anemia in other chronic diseases classified elsewhere: Secondary | ICD-10-CM

## 2018-08-31 LAB — CMP (CANCER CENTER ONLY)
ALK PHOS: 36 U/L — AB (ref 38–126)
ALT: <6 U/L (ref 0–44)
ANION GAP: 10 (ref 5–15)
AST: 12 U/L — ABNORMAL LOW (ref 15–41)
Albumin: 3.2 g/dL — ABNORMAL LOW (ref 3.5–5.0)
BUN: 17 mg/dL (ref 8–23)
CALCIUM: 9.5 mg/dL (ref 8.9–10.3)
CO2: 23 mmol/L (ref 22–32)
Chloride: 106 mmol/L (ref 98–111)
Creatinine: 0.98 mg/dL (ref 0.61–1.24)
Glucose, Bld: 134 mg/dL — ABNORMAL HIGH (ref 70–99)
Potassium: 3.4 mmol/L — ABNORMAL LOW (ref 3.5–5.1)
SODIUM: 139 mmol/L (ref 135–145)
Total Bilirubin: 0.6 mg/dL (ref 0.3–1.2)
Total Protein: 7 g/dL (ref 6.5–8.1)

## 2018-08-31 LAB — CBC WITH DIFFERENTIAL (CANCER CENTER ONLY)
Basophils Absolute: 0.1 10*3/uL (ref 0.0–0.1)
Basophils Relative: 1 %
EOS ABS: 0.3 10*3/uL (ref 0.0–0.5)
EOS PCT: 4 %
HCT: 36.1 % — ABNORMAL LOW (ref 38.4–49.9)
HEMOGLOBIN: 11.7 g/dL — AB (ref 13.0–17.1)
LYMPHS ABS: 1.5 10*3/uL (ref 0.9–3.3)
Lymphocytes Relative: 25 %
MCH: 28.2 pg (ref 27.2–33.4)
MCHC: 32.5 g/dL (ref 32.0–36.0)
MCV: 86.6 fL (ref 79.3–98.0)
MONOS PCT: 10 %
Monocytes Absolute: 0.6 10*3/uL (ref 0.1–0.9)
Neutro Abs: 3.7 10*3/uL (ref 1.5–6.5)
Neutrophils Relative %: 60 %
PLATELETS: 197 10*3/uL (ref 140–400)
RBC: 4.17 MIL/uL — ABNORMAL LOW (ref 4.20–5.82)
RDW: 16.1 % — ABNORMAL HIGH (ref 11.0–14.6)
WBC: 6.1 10*3/uL (ref 4.0–10.3)

## 2018-08-31 MED ORDER — INFLUENZA VAC SPLIT QUAD 0.5 ML IM SUSY
PREFILLED_SYRINGE | INTRAMUSCULAR | Status: AC
  Filled 2018-08-31: qty 0.5

## 2018-08-31 MED ORDER — INFLUENZA VAC SPLIT QUAD 0.5 ML IM SUSY
0.5000 mL | PREFILLED_SYRINGE | Freq: Once | INTRAMUSCULAR | Status: AC
  Administered 2018-08-31: 0.5 mL via INTRAMUSCULAR

## 2018-08-31 NOTE — Assessment & Plan Note (Signed)
He appears motivated and continue to participate actively with rehabilitation I continue to encourage to patient The patient wants a DNR order and I signed it and gave it to him today

## 2018-08-31 NOTE — Assessment & Plan Note (Signed)
Myeloma panel is pending There is nothing in his current blood work to suggest recurrence of disease I will call the patient next week once results are available If myeloma panel remains unremarkable, I plan to see him back next year for further follow-up We discussed the importance of preventive care and reviewed the vaccination programs. He does not have any prior allergic reactions to influenza vaccination. He agrees to proceed with influenza vaccination today and we will administer it today at the clinic.

## 2018-08-31 NOTE — Assessment & Plan Note (Signed)
This is likely anemia of chronic disease. The patient denies recent history of bleeding such as epistaxis, hematuria or hematochezia. He is asymptomatic from the anemia. We will observe for now.  He does not require transfusion now. I do not recommend any further work-up at this time.   

## 2018-08-31 NOTE — Telephone Encounter (Signed)
Gave avs and calendar ° °
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
## 2018-08-31 NOTE — Patient Instructions (Signed)
Preventing Influenza, Adult Influenza, more commonly known as "the flu," is a viral infection that mainly affects the respiratory tract. The respiratory tract includes structures that help you breathe, such as the lungs, nose, and throat. The flu causes many common cold symptoms, as well as a high fever and body aches. The flu spreads easily from person to person (is contagious). The flu is most common from December through March. This is called flu season.You can catch the flu virus by:  Breathing in droplets from an infected person's cough or sneeze.  Touching something that was recently contaminated with the virus and then touching your mouth, nose, or eyes.  What can I do to lower my risk? You can decrease your risk of getting the flu by:  Getting a flu shot (influenza vaccination) every year. This is the best way to prevent the flu. A flu shot is recommended for everyone age 6 months and older. ? It is best to get a flu shot in the fall, as soon as it is available. Getting a flu shot during winter or spring instead is still a good idea. Flu season can last into early spring. ? Preventing the flu through vaccination requires getting a new flu shot every year. This is because the flu virus changes slightly (mutates) from one year to the next. Even if a flu shot does not completely protect you from all flu virus mutations, it can reduce the severity of your illness and prevent dangerous complications of the flu. ? If you are pregnant, you can and should get a flu shot. ? If you have had a reaction to the shot in the past or if you are allergic to eggs, check with your health care provider before getting a flu shot. ? Sometimes the vaccine is available as a nasal spray. In some years, the nasal spray has not been as effective against the flu virus. Check with your health care provider if you have questions about this.  Practicing good health habits. This is especially important during flu  season. ? Avoid contact with people who are sick with flu or cold symptoms. ? Wash your hands with soap and water often. If soap and water are not available, use hand sanitizer. ? Avoid touching your hands to your face, especially when you have not washed your hands recently. ? Use a disinfectant to clean surfaces at home and at work that may be contaminated with the flu virus. ? Keep your body's disease-fighting system (immune system) in good shape by eating a healthy diet, drinking plenty of fluids, getting enough sleep, and exercising regularly.  If you do get the flu, avoid spreading it to others by:  Staying home until your symptoms have been gone for at least one day.  Covering your mouth and nose with your elbow when you cough or sneeze.  Avoiding close contact with others, especially babies and elderly people.  Why are these changes important? Getting a flu shot and practicing good health habits protects you as well as other people. If you get the flu, your friends, family, and co-workers are also at risk of getting it, because it spreads so easily to others. Each year, about 2 out of every 10 people get the flu. Having the flu can lead to complications, such as pneumonia, ear infection, and sinus infection. The flu also can be deadly, especially for babies, people older than age 65, and people who have serious long-term diseases. How is this treated? Most people recover   from the flu by resting at home and drinking plenty of fluids. However, a prescription antiviral medicine may reduce your flu symptoms and may make your flu go away sooner. This medicine must be started within a few days of getting flu symptoms. You can talk with your health care provider about whether you need an antiviral medicine. Antiviral medicine may be prescribed for people who are at risk for more serious flu symptoms. This includes people who:  Are older than age 65.  Are pregnant.  Have a condition that  makes the flu worse or more dangerous.  Where to find more information:  Centers for Disease Control and Prevention: www.cdc.gov/flu/index.htm  Flu.gov: www.flu.gov/prevention-vaccination  American Academy [employer]: familydoctor.org/familydoctor/en/kids/vaccines/preventing-the-flu.html Contact a health care provider if:  You have influenza and you develop new symptoms.  You have: ? Chest pain. ? Diarrhea. ? A fever.  Your cough gets worse, or you produce more mucus. Summary  The best way to prevent the flu is to get a flu shot every year in the fall.  Even if you get the flu after you have received the yearly vaccine, your flu may be milder and go away sooner because of your flu shot.  If you get the flu, antiviral medicines that are started with a few days of symptoms may reduce your flu symptoms and may make your flu go away sooner.  You can also help prevent the flu by practicing good health habits. This information is not intended to replace advice given to you by your health care provider. Make sure you discuss any questions you have with your health care provider. Document Released: 12/23/2015 Document Revised: 08/16/2016 Document Reviewed: 08/16/2016 Elsevier Interactive Patient Education  2018 [employer]  

## 2018-09-01 LAB — MULTIPLE MYELOMA PANEL, SERUM
ALPHA2 GLOB SERPL ELPH-MCNC: 0.8 g/dL (ref 0.4–1.0)
Albumin SerPl Elph-Mcnc: 3.1 g/dL (ref 2.9–4.4)
Albumin/Glob SerPl: 1 (ref 0.7–1.7)
Alpha 1: 0.3 g/dL (ref 0.0–0.4)
B-GLOBULIN SERPL ELPH-MCNC: 1.2 g/dL (ref 0.7–1.3)
Gamma Glob SerPl Elph-Mcnc: 0.9 g/dL (ref 0.4–1.8)
Globulin, Total: 3.2 g/dL (ref 2.2–3.9)
IGG (IMMUNOGLOBIN G), SERUM: 985 mg/dL (ref 700–1600)
IgA: 469 mg/dL — ABNORMAL HIGH (ref 61–437)
IgM (Immunoglobulin M), Srm: 85 mg/dL (ref 20–172)
TOTAL PROTEIN ELP: 6.3 g/dL (ref 6.0–8.5)

## 2018-09-02 ENCOUNTER — Telehealth: Payer: Self-pay | Admitting: *Deleted

## 2018-09-02 LAB — KAPPA/LAMBDA LIGHT CHAINS
Kappa free light chain: 66.1 mg/L — ABNORMAL HIGH (ref 3.3–19.4)
Kappa, lambda light chain ratio: 1.64 (ref 0.26–1.65)
Lambda free light chains: 40.2 mg/L — ABNORMAL HIGH (ref 5.7–26.3)

## 2018-09-02 NOTE — Telephone Encounter (Signed)
-----   Message from Heath Lark, MD sent at 09/02/2018  1:45 PM EDT ----- Regarding: MM panel ok Let him know no signs of myeloma recurrence ----- Message ----- From: Interface, [facility] Sent: 08/31/2018  12:51 PM EDT To: Heath Lark, MD

## 2018-09-02 NOTE — Telephone Encounter (Signed)
As noted below by Dr. Alvy Bimler, I informed the patient of his lab results. Also, there is no signs of myeloma recurrence. He verbalized understanding.

## 2019-08-15 ENCOUNTER — Telehealth: Payer: Self-pay

## 2019-08-15 NOTE — Telephone Encounter (Signed)
He called and left a message to call him.  Called back. He would like to cancel appts scheduled for 9/10. He will follow up with PCP in November and have his PCP contact the office if needed. Appts canceled.

## 2019-09-01 ENCOUNTER — Other Ambulatory Visit: Payer: Medicare Other

## 2019-09-01 ENCOUNTER — Ambulatory Visit: Payer: Medicare Other | Admitting: Hematology and Oncology
